# Patient Record
Sex: Female | Born: 1955
Health system: Southern US, Community
[De-identification: ages and names within clinical notes are randomized; demographics above are authoritative.]

## PROBLEM LIST (undated history)

## (undated) DIAGNOSIS — M1990 Unspecified osteoarthritis, unspecified site: Secondary | ICD-10-CM

## (undated) DIAGNOSIS — Z8489 Family history of other specified conditions: Secondary | ICD-10-CM

## (undated) DIAGNOSIS — E079 Disorder of thyroid, unspecified: Secondary | ICD-10-CM

## (undated) DIAGNOSIS — I10 Essential (primary) hypertension: Secondary | ICD-10-CM

## (undated) DIAGNOSIS — K219 Gastro-esophageal reflux disease without esophagitis: Secondary | ICD-10-CM

## (undated) DIAGNOSIS — E039 Hypothyroidism, unspecified: Secondary | ICD-10-CM

## (undated) HISTORY — DX: Essential (primary) hypertension: I10

## (undated) HISTORY — PX: CHOLECYSTECTOMY: SHX55

## (undated) HISTORY — PX: DILATION AND CURETTAGE OF UTERUS: SHX78

## (undated) HISTORY — PX: COLONOSCOPY: SHX174

## (undated) HISTORY — PX: OTHER SURGICAL HISTORY: SHX169

---

## 2004-10-23 DIAGNOSIS — E039 Hypothyroidism, unspecified: Secondary | ICD-10-CM | POA: Insufficient documentation

## 2004-12-17 ENCOUNTER — Ambulatory Visit: Payer: Self-pay

## 2005-01-01 ENCOUNTER — Ambulatory Visit: Payer: Self-pay | Admitting: Family Medicine

## 2005-08-04 DIAGNOSIS — K2100 Gastro-esophageal reflux disease with esophagitis, without bleeding: Secondary | ICD-10-CM | POA: Insufficient documentation

## 2005-08-06 ENCOUNTER — Ambulatory Visit: Payer: Self-pay | Admitting: Family Medicine

## 2006-03-10 ENCOUNTER — Ambulatory Visit: Payer: Self-pay

## 2007-03-30 DIAGNOSIS — E7800 Pure hypercholesterolemia, unspecified: Secondary | ICD-10-CM | POA: Insufficient documentation

## 2007-04-02 DIAGNOSIS — R7989 Other specified abnormal findings of blood chemistry: Secondary | ICD-10-CM | POA: Insufficient documentation

## 2007-05-04 ENCOUNTER — Ambulatory Visit: Payer: Self-pay

## 2008-03-30 DIAGNOSIS — E669 Obesity, unspecified: Secondary | ICD-10-CM | POA: Insufficient documentation

## 2008-04-28 ENCOUNTER — Ambulatory Visit: Payer: Self-pay | Admitting: Family Medicine

## 2008-05-04 ENCOUNTER — Ambulatory Visit: Payer: Self-pay | Admitting: Family Medicine

## 2008-06-06 ENCOUNTER — Ambulatory Visit: Payer: Self-pay | Admitting: Gastroenterology

## 2008-06-06 LAB — HM COLONOSCOPY

## 2009-04-13 DIAGNOSIS — D649 Anemia, unspecified: Secondary | ICD-10-CM | POA: Insufficient documentation

## 2009-04-19 DIAGNOSIS — E559 Vitamin D deficiency, unspecified: Secondary | ICD-10-CM | POA: Insufficient documentation

## 2009-05-08 ENCOUNTER — Ambulatory Visit: Payer: Self-pay | Admitting: Family Medicine

## 2010-07-17 ENCOUNTER — Ambulatory Visit: Payer: Self-pay

## 2010-07-30 ENCOUNTER — Ambulatory Visit: Payer: Self-pay

## 2011-02-04 ENCOUNTER — Ambulatory Visit: Payer: Self-pay

## 2011-08-20 ENCOUNTER — Ambulatory Visit: Payer: Self-pay | Admitting: Family Medicine

## 2012-08-26 ENCOUNTER — Ambulatory Visit: Payer: Self-pay | Admitting: Family Medicine

## 2013-08-29 ENCOUNTER — Ambulatory Visit: Payer: Self-pay | Admitting: Family Medicine

## 2014-08-16 LAB — HM PAP SMEAR: HM Pap smear: NEGATIVE

## 2014-12-20 ENCOUNTER — Ambulatory Visit: Payer: Self-pay | Admitting: Family Medicine

## 2015-07-23 ENCOUNTER — Other Ambulatory Visit: Payer: Self-pay | Admitting: Family Medicine

## 2015-07-23 DIAGNOSIS — O039 Complete or unspecified spontaneous abortion without complication: Secondary | ICD-10-CM | POA: Insufficient documentation

## 2015-07-23 DIAGNOSIS — N950 Postmenopausal bleeding: Secondary | ICD-10-CM | POA: Insufficient documentation

## 2015-07-23 NOTE — Telephone Encounter (Signed)
Refill request received from [facility] requesting levothyroxine 112 mcg.

## 2015-07-27 ENCOUNTER — Encounter: Payer: Self-pay | Admitting: Physician Assistant

## 2015-07-27 ENCOUNTER — Ambulatory Visit (INDEPENDENT_AMBULATORY_CARE_PROVIDER_SITE_OTHER): Payer: BLUE CROSS/BLUE SHIELD | Admitting: Physician Assistant

## 2015-07-27 VITALS — BP 120/82 | HR 76 | Temp 98.1°F | Resp 16 | Wt 199.6 lb

## 2015-07-27 DIAGNOSIS — E039 Hypothyroidism, unspecified: Secondary | ICD-10-CM | POA: Diagnosis not present

## 2015-07-27 MED ORDER — LEVOTHYROXINE SODIUM 112 MCG PO TABS: 112.0000 ug | ORAL_TABLET | Freq: Every day | ORAL | Status: DC

## 2015-07-27 NOTE — Progress Notes (Signed)
Patient: Kristine Strickland Female    DOB: 1956-06-12   59 y.o.   MRN: [id number] Visit Date: 07/27/2015  Today's Provider: Margaretann Loveless, PA-C   Chief Complaint  Patient presents with  . Hypothyroidism    Follow up   Subjective:    Thyroid Problem Presents for follow-up visit. Symptoms include palpitations (sometimes) and weight gain. Patient reports no anxiety, constipation, depressed mood, fatigue, leg swelling, nail problem or visual change. The symptoms have been stable. Past treatments include levothyroxine. The treatment provided significant relief.   she has been on the current dose of 112 g levothyroxin for many years now. She does feel that she is very well controlled at this dose. She has no other complaints today.     Allergies  Allergen Reactions  . Codeine     Not allergic just has N/V  . Penicillins     Was told she had an allery as a child.  She does not know what type of reaction she has.  . Sulfa Antibiotics     As child.  Reports mother told her she was allergic .  She is unsure of reaction.   Previous Medications   ASPIRIN 81 MG TABLET    Take 1 tablet by mouth daily.   CHOLECALCIFEROL (VITAMIN D) 1000 UNITS TABLET    Take 1,000 Units by mouth daily.   DOCUSATE SODIUM (COLACE) 100 MG CAPSULE    Take 2 capsules by mouth daily.   LEVOTHYROXINE (SYNTHROID, LEVOTHROID) 112 MCG TABLET    TAKE (1) TABLET DAILY FOR THYROID.   MELATONIN 5 MG TABS    Take 1 tablet by mouth at bedtime.   OMEGA-3 FATTY ACIDS (FISH OIL) 1200 MG CAPS    Take 2 capsules by mouth 2 (two) times daily.   OMEPRAZOLE (PRILOSEC) 20 MG CAPSULE    Take by mouth.    Review of Systems  Constitutional: Positive for weight gain. Negative for fatigue.  HENT: Negative.   Eyes: Negative.   Respiratory: Negative.   Cardiovascular: Positive for palpitations (sometimes).  Gastrointestinal: Negative.  Negative for constipation.  Endocrine: Negative.   Genitourinary: Negative.     Musculoskeletal: Negative.   Skin: Negative.   Allergic/Immunologic: Negative.   Neurological: Negative.   Hematological: Negative.   Psychiatric/Behavioral: Negative.  The patient is not nervous/anxious.     History  Substance Use Topics  . Smoking status: Never Smoker   . Smokeless tobacco: Never Used  . Alcohol Use: No   Objective:   BP 120/82 mmHg  Pulse 76  Temp(Src) 98.1 F (36.7 C) (Oral)  Resp 16  Wt 199 lb 9.6 oz (90.538 kg)  Physical Exam  Constitutional: She appears well-developed and well-nourished. No distress.  Neck: Normal range of motion. Neck supple. No JVD present. No tracheal deviation present. No thyromegaly present.  Cardiovascular: Normal rate, regular rhythm and normal heart sounds.  Exam reveals no gallop and no friction rub.   No murmur heard. Pulmonary/Chest: Effort normal and breath sounds normal. No respiratory distress. She has no wheezes. She has no rales.  Lymphadenopathy:    She has no cervical adenopathy.  Skin: She is not diaphoretic.  Vitals reviewed.       Assessment & Plan:     1. Hypothyroidism, unspecified hypothyroidism type Stable. Continue current medical treatment plan. She will return in 6 months for her physical. We will recheck all labs at that time. - levothyroxine (SYNTHROID, LEVOTHROID) 112 MCG tablet; Take  1 tablet (112 mcg total) by mouth daily before breakfast.  Dispense: 30 tablet; Refill: 6       Margaretann Loveless, PA-C  [facility]

## 2015-07-27 NOTE — Patient Instructions (Signed)
Hypothyroidism The thyroid is a large gland located in the lower front of your neck. The thyroid gland helps control metabolism. Metabolism is how your body handles food. It controls metabolism with the hormone thyroxine. When this gland is underactive (hypothyroid), it produces too little hormone.  CAUSES These include:   Absence or destruction of thyroid tissue.  Goiter due to iodine deficiency.  Goiter due to medications.  Congenital defects (since birth).  Problems with the pituitary. This causes a lack of TSH (thyroid stimulating hormone). This hormone tells the thyroid to turn out more hormone. SYMPTOMS  Lethargy (feeling as though you have no energy)  Cold intolerance  Weight gain (in spite of normal food intake)  Dry skin  Coarse hair  Menstrual irregularity (if severe, may lead to infertility)  Slowing of thought processes Cardiac problems are also caused by insufficient amounts of thyroid hormone. Hypothyroidism in the newborn is cretinism, and is an extreme form. It is important that this form be treated adequately and immediately or it will lead rapidly to retarded physical and mental development. DIAGNOSIS  To prove hypothyroidism, your caregiver may do blood tests and ultrasound tests. Sometimes the signs are hidden. It may be necessary for your caregiver to watch this illness with blood tests either before or after diagnosis and treatment. TREATMENT  Low levels of thyroid hormone are increased by using synthetic thyroid hormone. This is a safe, effective treatment. It usually takes about four weeks to gain the full effects of the medication. After you have the full effect of the medication, it will generally take another four weeks for problems to leave. Your caregiver may start you on low doses. If you have had heart problems the dose may be gradually increased. It is generally not an emergency to get rapidly to normal. HOME CARE INSTRUCTIONS   Take your  medications as your caregiver suggests. Let your caregiver know of any medications you are taking or start taking. Your caregiver will help you with dosage schedules.  As your condition improves, your dosage needs may increase. It will be necessary to have continuing blood tests as suggested by your caregiver.  Report all suspected medication side effects to your caregiver. SEEK MEDICAL CARE IF: Seek medical care if you develop:  Sweating.  Tremulousness (tremors).  Anxiety.  Rapid weight loss.  Heat intolerance.  Emotional swings.  Diarrhea.  Weakness. SEEK IMMEDIATE MEDICAL CARE IF:  You develop chest pain, an irregular heart beat (palpitations), or a rapid heart beat. MAKE SURE YOU:   Understand these instructions.  Will watch your condition.  Will get help right away if you are not doing well or get worse. Document Released: 12/08/2005 Document Revised: 03/01/2012 Document Reviewed: 07/28/2008 ExitCare Patient Information 2015 [employer]. This information is not intended to replace advice given to you by your health care provider. Make sure you discuss any questions you have with your health care provider.  

## 2015-08-29 ENCOUNTER — Ambulatory Visit (INDEPENDENT_AMBULATORY_CARE_PROVIDER_SITE_OTHER): Payer: BLUE CROSS/BLUE SHIELD | Admitting: Physician Assistant

## 2015-08-29 ENCOUNTER — Encounter: Payer: Self-pay | Admitting: Physician Assistant

## 2015-08-29 VITALS — BP 132/74 | HR 76 | Temp 98.3°F | Resp 16 | Wt 180.2 lb

## 2015-08-29 DIAGNOSIS — E039 Hypothyroidism, unspecified: Secondary | ICD-10-CM | POA: Diagnosis not present

## 2015-08-29 MED ORDER — LEVOTHYROXINE SODIUM 150 MCG PO TABS: 150.0000 ug | ORAL_TABLET | Freq: Every day | ORAL | Status: DC

## 2015-08-29 NOTE — Patient Instructions (Signed)
Hypothyroidism The thyroid is a large gland located in the lower front of your neck. The thyroid gland helps control metabolism. Metabolism is how your body handles food. It controls metabolism with the hormone thyroxine. When this gland is underactive (hypothyroid), it produces too little hormone.  CAUSES These include:   Absence or destruction of thyroid tissue.  Goiter due to iodine deficiency.  Goiter due to medications.  Congenital defects (since birth).  Problems with the pituitary. This causes a lack of TSH (thyroid stimulating hormone). This hormone tells the thyroid to turn out more hormone. SYMPTOMS  Lethargy (feeling as though you have no energy)  Cold intolerance  Weight gain (in spite of normal food intake)  Dry skin  Coarse hair  Menstrual irregularity (if severe, may lead to infertility)  Slowing of thought processes Cardiac problems are also caused by insufficient amounts of thyroid hormone. Hypothyroidism in the newborn is cretinism, and is an extreme form. It is important that this form be treated adequately and immediately or it will lead rapidly to retarded physical and mental development. DIAGNOSIS  To prove hypothyroidism, your caregiver may do blood tests and ultrasound tests. Sometimes the signs are hidden. It may be necessary for your caregiver to watch this illness with blood tests either before or after diagnosis and treatment. TREATMENT  Low levels of thyroid hormone are increased by using synthetic thyroid hormone. This is a safe, effective treatment. It usually takes about four weeks to gain the full effects of the medication. After you have the full effect of the medication, it will generally take another four weeks for problems to leave. Your caregiver may start you on low doses. If you have had heart problems the dose may be gradually increased. It is generally not an emergency to get rapidly to normal. HOME CARE INSTRUCTIONS   Take your  medications as your caregiver suggests. Let your caregiver know of any medications you are taking or start taking. Your caregiver will help you with dosage schedules.  As your condition improves, your dosage needs may increase. It will be necessary to have continuing blood tests as suggested by your caregiver.  Report all suspected medication side effects to your caregiver. SEEK MEDICAL CARE IF: Seek medical care if you develop:  Sweating.  Tremulousness (tremors).  Anxiety.  Rapid weight loss.  Heat intolerance.  Emotional swings.  Diarrhea.  Weakness. SEEK IMMEDIATE MEDICAL CARE IF:  You develop chest pain, an irregular heart beat (palpitations), or a rapid heart beat. MAKE SURE YOU:   Understand these instructions.  Will watch your condition.  Will get help right away if you are not doing well or get worse. Document Released: 12/08/2005 Document Revised: 03/01/2012 Document Reviewed: 07/28/2008 ExitCare Patient Information 2015 [employer]. This information is not intended to replace advice given to you by your health care provider. Make sure you discuss any questions you have with your health care provider.  

## 2015-08-29 NOTE — Progress Notes (Signed)
Patient ID: Kristine Strickland, female   DOB: 1956/01/07, 59 y.o.   MRN: [id number]  Hypothyroid, follow-up:  No results found for: TSH Wt Readings from Last 3 Encounters:  08/29/15 180 lb 3.2 oz (81.738 kg)  07/27/15 199 lb 9.6 oz (90.538 kg)   She does report a dry mouth, possible side effect from the new weight loss program medication, phentermine 15 mg.  She was last seen for hypothyroid 1 months ago.  Management since that visit includes no changers were made, [facility] told her to see her PCP. She reports excellent compliance with treatment. She is not having side effects.  She is exercising. She is experiencing none She denies none Weight trend: decreasing rapidly  ------------------------------------------------------------------------         Patient: Kristine Strickland Female    DOB: 12-23-1955   59 y.o.   MRN: [id number] Visit Date: 08/29/2015  Today's Provider: Margaretann Loveless, PA-C   Chief Complaint  Patient presents with  . Hypothyroidism    follow-up, taking lethyroxine 112 mcg.   Subjective:    HPI She states that she has not had the classic signs when her thyroid levels become abnormal such as tiredness, fatigue, sluggishness, and weight gain.  She has been on phentermine for weight loss and has also been exercising.  She states she feels great and has a lot of energy.  She had some lab work done through the weight loss clinic on 07/27/15 and TSH level was 17.100.  She had been on levothyroxine for many years.    Allergies  Allergen Reactions  . Codeine     Not allergic just has N/V  . Penicillins     Was told she had an allery as a child.  She does not know what type of reaction she has.  . Sulfa Antibiotics     As child.  Reports mother told her she was allergic .  She is unsure of reaction.   Previous Medications   ASPIRIN 81 MG TABLET    Take 1 tablet by mouth daily.   CHOLECALCIFEROL (VITAMIN D) 1000 UNITS  TABLET    Take 1,000 Units by mouth daily.   DOCUSATE SODIUM (COLACE) 100 MG CAPSULE    Take 2 capsules by mouth daily.   LEVOTHYROXINE (SYNTHROID, LEVOTHROID) 112 MCG TABLET    Take 1 tablet (112 mcg total) by mouth daily before breakfast.   MELATONIN 5 MG TABS    Take 1 tablet by mouth at bedtime.   OMEGA-3 FATTY ACIDS (FISH OIL) 1200 MG CAPS    Take 2 capsules by mouth 2 (two) times daily.   OMEPRAZOLE (PRILOSEC) 20 MG CAPSULE    Take by mouth.   PHENTERMINE 15 MG CAPSULE    Take 15 mg by mouth every morning.    Review of Systems  Constitutional: Negative.   HENT: Negative.   Respiratory: Negative.   Cardiovascular: Negative.   Gastrointestinal: Negative.   Endocrine: Negative.     Social History  Substance Use Topics  . Smoking status: Never Smoker   . Smokeless tobacco: Never Used  . Alcohol Use: No   Objective:   BP 132/74 mmHg  Pulse 76  Temp(Src) 98.3 F (36.8 C) (Oral)  Resp 16  Wt 180 lb 3.2 oz (81.738 kg)  SpO2 98%  Physical Exam  Constitutional: She appears well-developed and well-nourished. No distress.  Neck: Normal range of motion. Neck supple. No JVD present. No tracheal deviation  present. No thyromegaly present.  Cardiovascular: Normal rate, regular rhythm and normal heart sounds.  Exam reveals no gallop and no friction rub.   No murmur heard. Pulmonary/Chest: Effort normal and breath sounds normal. No respiratory distress. She has no wheezes. She has no rales.  Lymphadenopathy:    She has no cervical adenopathy.  Skin: She is not diaphoretic.  Vitals reviewed.       Assessment & Plan:     1. Hypothyroidism, unspecified hypothyroidism type Most recent labwork showed a TSH of 17.100 on 07/27/15.  I will increase her levothyroxine from to .  She has a follow up appointment in November 2016 for a CPE.  We will recheck her thyroid level at that time and adjust dose as needed if still not therapeutic. - levothyroxine (SYNTHROID, LEVOTHROID)  150 MCG tablet; Take 1 tablet (150 mcg total) by mouth daily.  Dispense: 90 tablet; Refill: 0       Margaretann Loveless, PA-C  [facility]

## 2015-09-07 ENCOUNTER — Encounter: Payer: Self-pay | Admitting: Physician Assistant

## 2015-09-28 ENCOUNTER — Other Ambulatory Visit: Payer: Self-pay | Admitting: Physician Assistant

## 2015-09-28 DIAGNOSIS — E039 Hypothyroidism, unspecified: Secondary | ICD-10-CM

## 2015-11-22 ENCOUNTER — Encounter: Payer: BLUE CROSS/BLUE SHIELD | Admitting: Physician Assistant

## 2015-11-26 ENCOUNTER — Ambulatory Visit (INDEPENDENT_AMBULATORY_CARE_PROVIDER_SITE_OTHER): Payer: BLUE CROSS/BLUE SHIELD | Admitting: Physician Assistant

## 2015-11-26 ENCOUNTER — Encounter: Payer: Self-pay | Admitting: Physician Assistant

## 2015-11-26 VITALS — BP 130/84 | HR 88 | Temp 98.6°F | Resp 16 | Ht 64.0 in | Wt 174.2 lb

## 2015-11-26 DIAGNOSIS — M25551 Pain in right hip: Secondary | ICD-10-CM | POA: Diagnosis not present

## 2015-11-26 DIAGNOSIS — D508 Other iron deficiency anemias: Secondary | ICD-10-CM | POA: Diagnosis not present

## 2015-11-26 DIAGNOSIS — E7800 Pure hypercholesterolemia, unspecified: Secondary | ICD-10-CM | POA: Diagnosis not present

## 2015-11-26 DIAGNOSIS — E559 Vitamin D deficiency, unspecified: Secondary | ICD-10-CM

## 2015-11-26 DIAGNOSIS — Z1239 Encounter for other screening for malignant neoplasm of breast: Secondary | ICD-10-CM | POA: Diagnosis not present

## 2015-11-26 DIAGNOSIS — Z0000 Encounter for general adult medical examination without abnormal findings: Secondary | ICD-10-CM

## 2015-11-26 DIAGNOSIS — E039 Hypothyroidism, unspecified: Secondary | ICD-10-CM | POA: Diagnosis not present

## 2015-11-26 DIAGNOSIS — Z23 Encounter for immunization: Secondary | ICD-10-CM | POA: Diagnosis not present

## 2015-11-26 MED ORDER — MELOXICAM 7.5 MG PO TABS: 7.5000 mg | ORAL_TABLET | Freq: Every day | ORAL | Status: DC

## 2015-11-26 NOTE — Progress Notes (Addendum)
Patient: Kristine Strickland, Female    DOB: [birth year], 59 y.o.   MRN: [id number] Visit Date: 11/26/2015  Today's Provider: Margaretann Loveless, PA-C   Chief Complaint  Patient presents with  . Annual Exam   Subjective:    Annual physical exam Kristine Strickland is a 59 y.o. female who presents today for health maintenance and complete physical. She feels well. She reports exercising 3 time a week does [place]. She reports she is sleeping well with the Melatonin. Pap smear was done last year 2015 and was normal. There is no family or personal history of cervical, ovarian or uterine cancer. Mammogram was done in 2015 and was normal. She does perform monthly self breast exams. There is no family history of breast cancer. Colonoscopy was done in 2008 by Dr. Servando Snare. She reports findings were normal and to repeat in 10 years. There is no family history of colon cancer.  Last PCP:11/03/14 BMD: 12/20/14 Normal repeat in 2 years. Pap:08/16/14 Normal, HPV-Negative Mammogram:08/16/14 BI-RADS Category 2: Benign Findings. Continued annual screening mammography is recommended Colonoscopy: Normal- 09/19/2007 - Dr. Servando Snare; Repeat in 10 years -----------------------------------------------------------------   Review of Systems  Constitutional: Negative.   HENT: Negative.   Eyes: Negative.   Respiratory: Negative.   Cardiovascular: Negative.   Gastrointestinal: Negative.   Endocrine: Negative.   Genitourinary: Negative.   Musculoskeletal: Negative.   Skin: Negative.   Allergic/Immunologic: Negative.   Neurological: Positive for numbness (hands).  Hematological: Negative.   Psychiatric/Behavioral: Negative.     Social History      She  reports that she has never smoked. She has never used smokeless tobacco. She reports that she does not drink alcohol or use illicit drugs.       Social History   Social History  . Marital Status: Married    Spouse Name: N/A  . Number of Children: N/A    . Years of Education: N/A   Social History Main Topics  . Smoking status: Never Smoker   . Smokeless tobacco: Never Used  . Alcohol Use: No  . Drug Use: No  . Sexual Activity: Not Asked   Other Topics Concern  . None   Social History Narrative    Patient Active Problem List   Diagnosis Date Noted  . Abortion, spontaneous 07/23/2015  . Postmenopausal bleeding 07/23/2015  . Avitaminosis D 04/19/2009  . Absolute anemia 04/13/2009  . Adiposity 03/30/2008  . Abnormal C-reactive protein 04/02/2007  . Hypercholesterolemia without hypertriglyceridemia 03/30/2007  . Esophagitis, reflux 08/04/2005  . Adult hypothyroidism 10/23/2004    Past Surgical History  Procedure Laterality Date  . Cholecystectomy    . Dilation and curettage of uterus    . Bladder stem    . Colonoscopy      Family History        Family Status  Relation Status Death Age  . Mother Alive   . Maternal Grandmother Deceased 70'S  . Maternal Grandfather Deceased 42'S  . Paternal Grandfather Deceased 68'S  . Father Alive         Her family history includes Alcohol abuse in her brother; Diverticulitis in her sister; Epilepsy in her mother; Heart attack in her paternal grandfather; Heart failure in her maternal grandfather; Hyperlipidemia in her mother; Hypertension in her maternal grandmother, mother, and paternal grandfather; Hypothyroidism in her sister; Pancreatic cancer in her maternal grandmother.    Allergies  Allergen Reactions  . Codeine     Not allergic just has  N/V  . Penicillins     Was told she had an allery as a child.  She does not know what type of reaction she has.  . Sulfa Antibiotics     As child.  Reports mother told her she was allergic .  She is unsure of reaction.    Previous Medications   ASPIRIN 81 MG TABLET    Take 1 tablet by mouth daily.   CHOLECALCIFEROL (VITAMIN D) 1000 UNITS TABLET    Take 1,000 Units by mouth daily.   DOCUSATE SODIUM (COLACE) 100 MG CAPSULE    Take 2  capsules by mouth daily.   LEVOTHYROXINE (SYNTHROID, LEVOTHROID) 150 MCG TABLET    TAKE (1) TABLET DAILY FOR THYROID.   MELATONIN 5 MG TABS    Take 1 tablet by mouth at bedtime.   OMEGA-3 FATTY ACIDS (FISH OIL) 1200 MG CAPS    Take 2 capsules by mouth 2 (two) times daily.   OMEPRAZOLE (PRILOSEC) 20 MG CAPSULE    Take by mouth.    Patient Care Team: Margaretann Loveless, PA-C as PCP - General (Physician Assistant)     Objective:   Vitals: BP 130/84 mmHg  Pulse 88  Temp(Src) 98.6 F (37 C) (Oral)  Resp 16  Ht  (1.626 m)  Wt 174 lb 3.2 oz (79.017 kg)  BMI 29.89 kg/m2   Physical Exam  Constitutional: She is oriented to person, place, and time. She appears well-developed and well-nourished. No distress.  HENT:  Head: Normocephalic and atraumatic.  Right Ear: Hearing, tympanic membrane, external ear and ear canal normal.  Left Ear: Hearing, tympanic membrane, external ear and ear canal normal.  Nose: Nose normal.  Mouth/Throat: Uvula is midline, oropharynx is clear and moist and mucous membranes are normal. No oropharyngeal exudate.  Eyes: Conjunctivae and EOM are normal. Pupils are equal, round, and reactive to light. Right eye exhibits no discharge. Left eye exhibits no discharge. No scleral icterus.  Neck: Normal range of motion. Neck supple. No JVD present. Carotid bruit is not present. No tracheal deviation present. No thyromegaly present.  Cardiovascular: Normal rate, regular rhythm, normal heart sounds and intact distal pulses.  Exam reveals no gallop and no friction rub.   No murmur heard. Pulmonary/Chest: Effort normal and breath sounds normal. No respiratory distress. She has no wheezes. She has no rales. She exhibits no tenderness. Right breast exhibits no inverted nipple, no mass, no nipple discharge, no skin change and no tenderness. Left breast exhibits no inverted nipple, no mass, no nipple discharge, no skin change and no tenderness. Breasts are symmetrical.    Abdominal: Soft. Bowel sounds are normal. She exhibits no distension and no mass. There is no tenderness. There is no rebound and no guarding. Hernia confirmed negative in the right inguinal area and confirmed negative in the left inguinal area.  Genitourinary: Rectum normal, vagina normal and uterus normal. No breast swelling, tenderness, discharge or bleeding. Pelvic exam was performed with patient supine. There is no rash, tenderness, lesion or injury on the right labia. There is no rash, tenderness, lesion or injury on the left labia. Cervix exhibits no motion tenderness, no discharge and no friability. Right adnexum displays no mass, no tenderness and no fullness. Left adnexum displays no mass, no tenderness and no fullness. No erythema, tenderness or bleeding in the vagina. No signs of injury around the vagina. No vaginal discharge found.  Musculoskeletal: Normal range of motion. She exhibits no edema or tenderness.  Lymphadenopathy:  She has no cervical adenopathy.       Right: No inguinal adenopathy present.       Left: No inguinal adenopathy present.  Neurological: She is alert and oriented to person, place, and time. She has normal reflexes. No cranial nerve deficit. Coordination normal.  Skin: Skin is warm and dry. No rash noted. She is not diaphoretic.  Psychiatric: She has a normal mood and affect. Her behavior is normal. Judgment and thought content normal.  Vitals reviewed.    Depression Screen No flowsheet data found.    Assessment & Plan:     Routine Health Maintenance and Physical Exam  1. Annual physical exam Normal physical exam today. I will check labs and follow-up pending lab results. If labs are stable I will see her back in one year for her repeat physical exam. She may call the office in the meantime for any acute issue, questions or concerns.  2. Need for influenza vaccination Flu vaccine was given today without complication. - Flu Vaccine QUAD 36+ mos  IM  3. Screening for breast cancer Breast exam today was normal. She does perform self monthly breast exams. There is no family history of breast cancer. [facility] was given to patient so that she may call and schedule an appointment for her annual screening mammogram at her convenience. - MM DIGITAL SCREENING BILATERAL; Future  4. Arthralgia of hip or thigh, right Right hip arthritis has been stable. She would like a refill of her meloxicam. She does not take meloxicam daily only when she has a flareup of her arthritis. I will refill her meloxicam as below. She is to call the office if symptoms become persistent and worsened. - meloxicam (MOBIC) 7.5 MG tablet; Take 1 tablet (7.5 mg total) by mouth daily.  Dispense: 60 tablet; Refill: 3  5. Avitaminosis D Currently stable on vitamin D supplement 1000 international units daily. I will recheck her vitamin D level and follow-up pending results. In the meantime she is to continue her vitamin D supplement. - Comprehensive Metabolic Panel (CMET) - Vitamin D (25 hydroxy)  6. Hypothyroidism, unspecified hypothyroidism type Currently stable on levothyroxine 150 g. I will recheck TSH and follow-up pending lab results. If labs are stable she will continue current dose and we will recheck in one year. - TSH *Addendum: TSH was low at 0.251 so will lower levothyroxine dose to 125mcg instead of 150mcg.  We will recheck TSH in 3 months.  7. Other iron deficiency anemias History of absolute anemia. I will recheck blood count and follow-up pending lab results. - CBC with Differential - Comprehensive Metabolic Panel (CMET)  8. Hypercholesterolemia without hypertriglyceridemia Currently stable and has been controlled with lifestyle modifications including diet and exercise. She also takes Fish oil daily. I will recheck her cholesterol panel and follow-up with her pending lab results. If labs are stable she will continue current  medical treatment with lifestyle changes and follow-up in one year with repeat labs. - Lipid panel   Exercise Activities and Dietary recommendations Goals    None      Immunization History  Administered Date(s) Administered  . Td 10/23/2004  . Tdap 07/08/2011    Health Maintenance  Topic Date Due  . Hepatitis C Screening  July 03, 1956  . HIV Screening  09/18/1973  . PAP SMEAR  09/19/1979  . MAMMOGRAM  09/18/2008  . COLONOSCOPY  09/18/2008  . INFLUENZA VACCINE  07/23/2015  . TETANUS/TDAP  07/07/2021  Discussed health benefits of physical activity, and encouraged her to engage in regular exercise appropriate for her age and condition.    --------------------------------------------------------------------

## 2015-11-26 NOTE — Patient Instructions (Signed)

## 2015-11-27 ENCOUNTER — Telehealth: Payer: Self-pay

## 2015-11-27 LAB — CBC WITH DIFFERENTIAL/PLATELET
BASOS: 1 %
Basophils Absolute: 0 10*3/uL (ref 0.0–0.2)
EOS (ABSOLUTE): 0.2 10*3/uL (ref 0.0–0.4)
EOS: 4 %
HEMATOCRIT: 38.7 % (ref 34.0–46.6)
Hemoglobin: 12.9 g/dL (ref 11.1–15.9)
IMMATURE GRANS (ABS): 0 10*3/uL (ref 0.0–0.1)
IMMATURE GRANULOCYTES: 0 %
LYMPHS: 29 %
Lymphocytes Absolute: 1.5 10*3/uL (ref 0.7–3.1)
MCH: 26.6 pg (ref 26.6–33.0)
MCHC: 33.3 g/dL (ref 31.5–35.7)
MCV: 80 fL (ref 79–97)
MONOS ABS: 0.4 10*3/uL (ref 0.1–0.9)
Monocytes: 8 %
NEUTROS PCT: 58 %
Neutrophils Absolute: 3 10*3/uL (ref 1.4–7.0)
PLATELETS: 303 10*3/uL (ref 150–379)
RBC: 4.85 x10E6/uL (ref 3.77–5.28)
RDW: 14.8 % (ref 12.3–15.4)
WBC: 5.2 10*3/uL (ref 3.4–10.8)

## 2015-11-27 LAB — COMPREHENSIVE METABOLIC PANEL
A/G RATIO: 1.8 (ref 1.1–2.5)
ALT: 12 IU/L (ref 0–32)
AST: 19 IU/L (ref 0–40)
Albumin: 4.4 g/dL (ref 3.5–5.5)
Alkaline Phosphatase: 72 IU/L (ref 39–117)
BUN/Creatinine Ratio: 20 (ref 9–23)
BUN: 16 mg/dL (ref 6–24)
Bilirubin Total: 0.3 mg/dL (ref 0.0–1.2)
CALCIUM: 9.6 mg/dL (ref 8.7–10.2)
CO2: 26 mmol/L (ref 18–29)
CREATININE: 0.8 mg/dL (ref 0.57–1.00)
Chloride: 99 mmol/L (ref 97–106)
GFR, EST AFRICAN AMERICAN: 95 mL/min/{1.73_m2} (ref >59)
GFR, EST NON AFRICAN AMERICAN: 82 mL/min/{1.73_m2} (ref >59)
Globulin, Total: 2.5 g/dL (ref 1.5–4.5)
Glucose: 89 mg/dL (ref 65–99)
Potassium: 4.2 mmol/L (ref 3.5–5.2)
Sodium: 141 mmol/L (ref 136–144)
TOTAL PROTEIN: 6.9 g/dL (ref 6.0–8.5)

## 2015-11-27 LAB — LIPID PANEL
CHOL/HDL RATIO: 2.8 ratio (ref 0.0–4.4)
Cholesterol, Total: 214 mg/dL — ABNORMAL HIGH (ref 100–199)
HDL: 76 mg/dL (ref >39)
LDL CALC: 128 mg/dL — AB (ref 0–99)
TRIGLYCERIDES: 52 mg/dL (ref 0–149)
VLDL CHOLESTEROL CAL: 10 mg/dL (ref 5–40)

## 2015-11-27 LAB — VITAMIN D 25 HYDROXY (VIT D DEFICIENCY, FRACTURES): Vit D, 25-Hydroxy: 26.6 ng/mL — ABNORMAL LOW (ref 30.0–100.0)

## 2015-11-27 LAB — TSH: TSH: 0.251 u[IU]/mL — ABNORMAL LOW (ref 0.450–4.500)

## 2015-11-27 MED ORDER — LEVOTHYROXINE SODIUM 125 MCG PO TABS: 125.0000 ug | ORAL_TABLET | Freq: Every day | ORAL | Status: DC

## 2015-11-27 NOTE — Telephone Encounter (Signed)
Patient advised as directed below. Patient voice understanding.  Thanks,  -Joseline 

## 2015-11-27 NOTE — Telephone Encounter (Signed)
-----   Message from Margaretann LovelessJennifer M Burnette, [place] JerseyPA-C sent at 11/27/2015  8:44 AM EST ----- All labs are within normal limits and stable with exception of TSH which is slightly low at 0.251.  We may need to decrease your dose of levothyroxine to 125mcg.  I will send in the new Rx for you.  We will recheck in 3 months to make sure that the new dose is appropriate.  Continue Vit D supplement as well. Thanks! -JB

## 2015-11-27 NOTE — Addendum Note (Signed)
Addended by: Margaretann LovelessBURNETTE, Ahan Eisenberger M on: 11/27/2015 08:47 AM   Modules accepted: Orders, Medications, SmartSet

## 2016-01-07 ENCOUNTER — Ambulatory Visit
Admission: RE | Admit: 2016-01-07 | Discharge: 2016-01-07 | Disposition: A | Discharge status: Home / Self Care | Payer: BLUE CROSS/BLUE SHIELD | Source: Ambulatory Visit | Attending: Physician Assistant | Admitting: Physician Assistant

## 2016-01-07 DIAGNOSIS — Z1239 Encounter for other screening for malignant neoplasm of breast: Secondary | ICD-10-CM

## 2016-01-07 DIAGNOSIS — Z1231 Encounter for screening mammogram for malignant neoplasm of breast: Secondary | ICD-10-CM | POA: Insufficient documentation

## 2016-01-08 ENCOUNTER — Telehealth: Payer: Self-pay

## 2016-01-08 NOTE — Telephone Encounter (Signed)
-----   Message from Margaretann Loveless, PA-C sent at 01/08/2016  8:33 AM EST ----- Normal mammogram. Repeat screening in one year.

## 2016-01-08 NOTE — Telephone Encounter (Signed)
Patient advised as directed below.  Thanks,  -Angelie Kram 

## 2016-05-06 ENCOUNTER — Telehealth: Payer: Self-pay | Admitting: Physician Assistant

## 2016-05-06 DIAGNOSIS — E039 Hypothyroidism, unspecified: Secondary | ICD-10-CM

## 2016-05-06 DIAGNOSIS — E559 Vitamin D deficiency, unspecified: Secondary | ICD-10-CM

## 2016-05-06 NOTE — Telephone Encounter (Signed)
I was not sure if she needed other labs.

## 2016-05-06 NOTE — Telephone Encounter (Signed)
Pt wants to pick up lab sheet to have thyroid rechecked.  Pt will pick up.  Thanks Fortune Brandsteri

## 2016-05-06 NOTE — Telephone Encounter (Signed)
LM that lab order is ready up front for pick up.  Thanks,  -Marybel Alcott

## 2016-05-06 NOTE — Telephone Encounter (Signed)
Lab ordered.

## 2016-05-07 ENCOUNTER — Telehealth: Payer: Self-pay

## 2016-05-07 LAB — TSH: TSH: 22.29 u[IU]/mL — ABNORMAL HIGH (ref 0.450–4.500)

## 2016-05-07 MED ORDER — LEVOTHYROXINE SODIUM 137 MCG PO TABS: 137.0000 ug | ORAL_TABLET | Freq: Every day | ORAL | Status: DC

## 2016-05-07 NOTE — Telephone Encounter (Signed)
Patient advised as directed below. Patient verbalized understanding.  

## 2016-05-07 NOTE — Addendum Note (Signed)
Addended by: Margaretann LovelessBURNETTE, Stacie Knutzen M on: 05/07/2016 08:38 AM   Modules accepted: Orders, Medications

## 2016-05-07 NOTE — Telephone Encounter (Signed)
-----   Message from Margaretann LovelessJennifer M Burnette, [place]PA-C sent at 05/07/2016  8:45 AM EDT ----- TSH is high so I am going to increase the levothyroxine dose. The 150mcg had been too strong and current dose is too low so I will go inbetween with 137mcg and we can recheck in 3-6 months again. [facility] [place].

## 2016-05-07 NOTE — Telephone Encounter (Signed)
LMTCB

## 2016-05-07 NOTE — Telephone Encounter (Signed)
Pt is returning call.  ZO#[phone]/[phone]/MW

## 2016-10-22 DIAGNOSIS — D1800 Hemangioma unspecified site: Secondary | ICD-10-CM | POA: Diagnosis not present

## 2016-10-22 DIAGNOSIS — Z1283 Encounter for screening for malignant neoplasm of skin: Secondary | ICD-10-CM | POA: Diagnosis not present

## 2016-10-22 DIAGNOSIS — D485 Neoplasm of uncertain behavior of skin: Secondary | ICD-10-CM | POA: Diagnosis not present

## 2016-10-22 DIAGNOSIS — D229 Melanocytic nevi, unspecified: Secondary | ICD-10-CM | POA: Diagnosis not present

## 2016-10-27 ENCOUNTER — Telehealth: Payer: Self-pay | Admitting: Physician Assistant

## 2016-10-27 DIAGNOSIS — E039 Hypothyroidism, unspecified: Secondary | ICD-10-CM

## 2016-10-27 MED ORDER — LEVOTHYROXINE SODIUM 137 MCG PO TABS
137.0000 ug | ORAL_TABLET | Freq: Every day | ORAL | 0 refills | Status: DC
Start: 2016-10-27 — End: 2016-11-27

## 2016-10-27 NOTE — Telephone Encounter (Signed)
Pt needs refill on her Synthroid.  She has appt for a CPE in December.  She uses [facility] in [place].  Her call back is [phone]  Thanks, Barth Kirksteri

## 2016-10-27 NOTE — Telephone Encounter (Signed)
Levothyroxine refilled

## 2016-10-27 NOTE — Telephone Encounter (Signed)
Refill request for review. KW 

## 2016-11-26 ENCOUNTER — Ambulatory Visit (INDEPENDENT_AMBULATORY_CARE_PROVIDER_SITE_OTHER): Payer: BLUE CROSS/BLUE SHIELD | Admitting: Physician Assistant

## 2016-11-26 ENCOUNTER — Encounter: Payer: Self-pay | Admitting: Physician Assistant

## 2016-11-26 VITALS — BP 130/82 | HR 84 | Temp 98.3°F | Resp 16 | Ht 64.0 in | Wt 209.6 lb

## 2016-11-26 DIAGNOSIS — Z1159 Encounter for screening for other viral diseases: Secondary | ICD-10-CM | POA: Diagnosis not present

## 2016-11-26 DIAGNOSIS — M25551 Pain in right hip: Secondary | ICD-10-CM | POA: Diagnosis not present

## 2016-11-26 DIAGNOSIS — Z1231 Encounter for screening mammogram for malignant neoplasm of breast: Secondary | ICD-10-CM

## 2016-11-26 DIAGNOSIS — Z131 Encounter for screening for diabetes mellitus: Secondary | ICD-10-CM | POA: Diagnosis not present

## 2016-11-26 DIAGNOSIS — K120 Recurrent oral aphthae: Secondary | ICD-10-CM | POA: Diagnosis not present

## 2016-11-26 DIAGNOSIS — D508 Other iron deficiency anemias: Secondary | ICD-10-CM

## 2016-11-26 DIAGNOSIS — Z0000 Encounter for general adult medical examination without abnormal findings: Secondary | ICD-10-CM | POA: Diagnosis not present

## 2016-11-26 DIAGNOSIS — Z1239 Encounter for other screening for malignant neoplasm of breast: Secondary | ICD-10-CM

## 2016-11-26 DIAGNOSIS — E7800 Pure hypercholesterolemia, unspecified: Secondary | ICD-10-CM

## 2016-11-26 DIAGNOSIS — E039 Hypothyroidism, unspecified: Secondary | ICD-10-CM | POA: Diagnosis not present

## 2016-11-26 MED ORDER — MELOXICAM 7.5 MG PO TABS: 7.5000 mg | ORAL_TABLET | Freq: Every day | ORAL | 3 refills | Status: DC

## 2016-11-26 NOTE — Patient Instructions (Signed)

## 2016-11-26 NOTE — Progress Notes (Signed)
Patient: Kristine PostBarbara J Strickland, Female    DOB: [birth year], 58 y.o.   MRN: [id number] Visit Date: 11/26/2016  Today's Provider: Margaretann LovelessJennifer M Ramonda Galyon, PA-C   No chief complaint on file.  Subjective:    Annual physical exam Kristine Strickland is a 60 y.o. female who presents today for health maintenance and complete physical. She feels well. She reports exercising very little. She reports she is sleeping fairly well.  She is concern that she has gained a lot of weight. She however has not been exercising like she used to and has not been watching what she is eating. She also reports that she got her flu vaccine 10/2016 at the [facility]. She reports she got other shots and that she will fax the record over.  CPE:11/26/15 Mammogram:01/07/16 BI-RADS 1 BMD: 12/20/14 Colonoscopy: Normal repeat in 10 years. -----------------------------------------------------------------  Review of Systems  Constitutional: Positive for appetite change.  HENT: Positive for mouth sores and voice change.   Eyes: Negative.   Respiratory: Negative.   Cardiovascular: Positive for palpitations.  Gastrointestinal: Negative.   Endocrine: Positive for polyphagia.  Genitourinary: Negative.   Musculoskeletal: Positive for arthralgias.  Skin: Negative.   Allergic/Immunologic: Negative.   Neurological: Negative.   Hematological: Negative.   Psychiatric/Behavioral: Negative.     Social History      She  reports that she has never smoked. She has never used smokeless tobacco. She reports that she does not drink alcohol or use drugs.       Social History   Social History  . Marital status: Married    Spouse name: N/A  . Number of children: N/A  . Years of education: N/A   Social History Main Topics  . Smoking status: Never Smoker  . Smokeless tobacco: Never Used  . Alcohol use No  . Drug use: No  . Sexual activity: Not Asked   Other Topics Concern  . None   Social History Narrative    . None    History reviewed. No pertinent past medical history.   Patient Active Problem List   Diagnosis Date Noted  . Abortion, spontaneous 07/23/2015  . Postmenopausal bleeding 07/23/2015  . Avitaminosis D 04/19/2009  . Absolute anemia 04/13/2009  . Adiposity 03/30/2008  . Abnormal C-reactive protein 04/02/2007  . Hypercholesterolemia without hypertriglyceridemia 03/30/2007  . Esophagitis, reflux 08/04/2005  . Adult hypothyroidism 10/23/2004    Past Surgical History:  Procedure Laterality Date  . BLADDER STEM    . CHOLECYSTECTOMY    . COLONOSCOPY    . DILATION AND CURETTAGE OF UTERUS      Family History        Family Status  Relation Status  . Mother Alive  . Maternal Grandmother Deceased at age 58'S  . Maternal Grandfather Deceased at age 58'S  . Paternal Grandfather Deceased at age 58'S  . Father Alive        Her family history includes Alcohol abuse in her brother; Diverticulitis in her sister; Epilepsy in her mother; Heart attack in her paternal grandfather; Heart failure in her maternal grandfather; Hyperlipidemia in her mother; Hypertension in her maternal grandmother, mother, and paternal grandfather; Hypothyroidism in her sister; Pancreatic cancer in her maternal grandmother.     Allergies  Allergen Reactions  . Codeine     Not allergic just has N/V  . Penicillins     Was told she had an allery as a child.  She does not know what type of reaction  she has.  . Sulfa Antibiotics     As child.  Reports mother told her she was allergic .  She is unsure of reaction.     Current Outpatient Prescriptions:  .  aspirin 81 MG tablet, Take 1 tablet by mouth daily., Disp: , Rfl:  .  cholecalciferol (VITAMIN D) 1000 UNITS tablet, Take 1,000 Units by mouth daily., Disp: , Rfl:  .  docusate sodium (COLACE) 100 MG capsule, Take 2 capsules by mouth daily., Disp: , Rfl:  .  levothyroxine (SYNTHROID, LEVOTHROID) 137 MCG tablet, Take 1 tablet (137 mcg total) by mouth  daily before breakfast., Disp: 90 tablet, Rfl: 0 .  Melatonin 5 MG TABS, Take 1 tablet by mouth at bedtime., Disp: , Rfl:  .  meloxicam (MOBIC) 7.5 MG tablet, Take 1 tablet (7.5 mg total) by mouth daily., Disp: 60 tablet, Rfl: 3 .  omeprazole (PRILOSEC) 20 MG capsule, Take by mouth., Disp: , Rfl:  .  Omega-3 Fatty Acids (FISH OIL) 1200 MG CAPS, Take 2 capsules by mouth 2 (two) times daily., Disp: , Rfl:    Patient Care Team: Margaretann LovelessJennifer M Lourdes Manning, PA-C as PCP - General (Physician Assistant)      Objective:   Vitals: BP 130/82 (BP Location: Right Arm, Patient Position: Sitting, Cuff Size: Normal)   Pulse 84   Temp 98.3 F (36.8 C) (Oral)   Resp 16   Ht 5\' 4"  (1.626 m)   Wt 209 lb 9.6 oz (95.1 kg)   BMI 35.98 kg/m    Physical Exam  Constitutional: She is oriented to person, place, and time. She appears well-developed and well-nourished. No distress.  HENT:  Head: Normocephalic and atraumatic.  Right Ear: Hearing, tympanic membrane, external ear and ear canal normal.  Left Ear: Hearing, tympanic membrane, external ear and ear canal normal.  Nose: Nose normal.  Mouth/Throat: Uvula is midline, oropharynx is clear and moist and mucous membranes are normal. Oral lesions (round ulceration with white border (halo) under tongue and on tip of tongue) present. No oropharyngeal exudate.  Eyes: Conjunctivae and EOM are normal. Pupils are equal, round, and reactive to light. Right eye exhibits no discharge. Left eye exhibits no discharge. No scleral icterus.  Neck: Normal range of motion. Neck supple. No JVD present. Carotid bruit is not present. No tracheal deviation present. No thyromegaly present.  Cardiovascular: Normal rate, regular rhythm, normal heart sounds and intact distal pulses.  Exam reveals no gallop and no friction rub.   No murmur heard. Pulmonary/Chest: Effort normal and breath sounds normal. No respiratory distress. She has no wheezes. She has no rales. She exhibits no tenderness.  Right breast exhibits no inverted nipple, no mass, no nipple discharge, no skin change and no tenderness. Left breast exhibits no inverted nipple, no mass, no nipple discharge, no skin change and no tenderness. Breasts are symmetrical.  Abdominal: Soft. Bowel sounds are normal. She exhibits no distension and no mass. There is no tenderness. There is no rebound and no guarding.  Musculoskeletal: Normal range of motion. She exhibits no edema or tenderness.  Lymphadenopathy:    She has no cervical adenopathy.  Neurological: She is alert and oriented to person, place, and time.  Skin: Skin is warm and dry. No rash noted. She is not diaphoretic.  Psychiatric: She has a normal mood and affect. Her behavior is normal. Judgment and thought content normal.  Vitals reviewed.   Depression Screen No flowsheet data found.   Assessment & Plan:  Routine Health Maintenance and Physical Exam  Exercise Activities and Dietary recommendations Goals    None      Immunization History  Administered Date(s) Administered  . Influenza,inj,Quad PF,36+ Mos 11/26/2015  . Td 10/23/2004  . Tdap 07/08/2011    Health Maintenance  Topic Date Due  . Hepatitis C Screening  1956/01/14  . HIV Screening  09/18/1973  . INFLUENZA VACCINE  07/22/2016  . MAMMOGRAM  01/06/2017  . PAP SMEAR  08/16/2017  . COLONOSCOPY  09/18/2017  . TETANUS/TDAP  07/07/2021     Discussed health benefits of physical activity, and encouraged her to engage in regular exercise appropriate for her age and condition.   1. Annual physical exam Normal physical exam today with the exception of the apthous ulcers. Will check labs as below and f/u pending lab results. If labs are stable and WNL she will not need to have these rechecked for one year at her next annual physical exam. She is to call the office in the meantime if she has any acute issue, questions or concerns. - CBC with Differential/Platelet - Comprehensive metabolic  panel - Hemoglobin A1c  2. Adult hypothyroidism Previously stable on levothyroxine 137 g. Will check labs as below and f/u pending results. - TSH  3. Other iron deficiency anemia Will check labs as below and f/u pending results. - CBC with Differential/Platelet  4. Hypercholesterolemia without hypertriglyceridemia History of this but diet controlled. We'll check labs as below and follow-up pending results. - CBC with Differential/Platelet - Comprehensive metabolic panel - Hemoglobin A1c - Lipid panel  5. Screening for breast cancer Breast exam today was normal. There is no family history of breast cancer. Mammogram is ordered as below. Patient is aware mammogram is not due until mid January. She was given information for [facility] breast clinic so that she may call and schedule this at her convenience. - MM DIGITAL SCREENING BILATERAL; Future  6. Screening for diabetes mellitus Will check labs as below and f/u pending results. - Hemoglobin A1c  7. Need for hepatitis C screening test - Hepatitis C antibody  8. Arthralgia of hip or thigh, right Stable. Diagnosis pulled for medication refill. Continue current medical treatment plan. - meloxicam (MOBIC) 7.5 MG tablet; Take 1 tablet (7.5 mg total) by mouth daily.  Dispense: 60 tablet; Refill: 3  9. Aphthous ulcer of mouth Advise patient to cut back on any foods with acid. She voiced understanding. She is to call the office if these areas do not heal on their own. If the do not heal will send in [employer] Magic mouthwash with lidocaine.   --------------------------------------------------------------------    Margaretann Loveless, PA-C  [facility]

## 2016-11-27 ENCOUNTER — Telehealth: Payer: Self-pay

## 2016-11-27 LAB — CBC WITH DIFFERENTIAL/PLATELET
BASOS ABS: 0 10*3/uL (ref 0.0–0.2)
Basos: 0 %
EOS (ABSOLUTE): 0.2 10*3/uL (ref 0.0–0.4)
Eos: 4 %
Hematocrit: 35.4 % (ref 34.0–46.6)
Hemoglobin: 11.9 g/dL (ref 11.1–15.9)
Immature Grans (Abs): 0 10*3/uL (ref 0.0–0.1)
Immature Granulocytes: 0 %
LYMPHS ABS: 1.5 10*3/uL (ref 0.7–3.1)
Lymphs: 26 %
MCH: 26.4 pg — AB (ref 26.6–33.0)
MCHC: 33.6 g/dL (ref 31.5–35.7)
MCV: 79 fL (ref 79–97)
MONOS ABS: 0.4 10*3/uL (ref 0.1–0.9)
Monocytes: 7 %
NEUTROS ABS: 3.7 10*3/uL (ref 1.4–7.0)
Neutrophils: 63 %
PLATELETS: 299 10*3/uL (ref 150–379)
RBC: 4.51 x10E6/uL (ref 3.77–5.28)
RDW: 14.5 % (ref 12.3–15.4)
WBC: 5.8 10*3/uL (ref 3.4–10.8)

## 2016-11-27 LAB — COMPREHENSIVE METABOLIC PANEL
ALBUMIN: 4.5 g/dL (ref 3.5–5.5)
ALK PHOS: 80 IU/L (ref 39–117)
ALT: 16 IU/L (ref 0–32)
AST: 25 IU/L (ref 0–40)
Albumin/Globulin Ratio: 2.1 (ref 1.2–2.2)
BILIRUBIN TOTAL: 0.3 mg/dL (ref 0.0–1.2)
BUN / CREAT RATIO: 37 — AB (ref 9–23)
BUN: 24 mg/dL (ref 6–24)
CHLORIDE: 102 mmol/L (ref 96–106)
CO2: 23 mmol/L (ref 18–29)
Calcium: 9.4 mg/dL (ref 8.7–10.2)
Creatinine, Ser: 0.65 mg/dL (ref 0.57–1.00)
GFR calc Af Amer: 113 mL/min/{1.73_m2} (ref >59)
GFR calc non Af Amer: 98 mL/min/{1.73_m2} (ref >59)
GLUCOSE: 86 mg/dL (ref 65–99)
Globulin, Total: 2.1 g/dL (ref 1.5–4.5)
Potassium: 4.5 mmol/L (ref 3.5–5.2)
Sodium: 142 mmol/L (ref 134–144)
Total Protein: 6.6 g/dL (ref 6.0–8.5)

## 2016-11-27 LAB — LIPID PANEL
CHOLESTEROL TOTAL: 200 mg/dL — AB (ref 100–199)
Chol/HDL Ratio: 3.3 ratio units (ref 0.0–4.4)
HDL: 61 mg/dL (ref >39)
LDL CALC: 126 mg/dL — AB (ref 0–99)
Triglycerides: 63 mg/dL (ref 0–149)
VLDL Cholesterol Cal: 13 mg/dL (ref 5–40)

## 2016-11-27 LAB — TSH: TSH: 0.316 u[IU]/mL — ABNORMAL LOW (ref 0.450–4.500)

## 2016-11-27 LAB — HEPATITIS C ANTIBODY

## 2016-11-27 LAB — HEMOGLOBIN A1C
Est. average glucose Bld gHb Est-mCnc: 117 mg/dL
Hgb A1c MFr Bld: 5.7 % — ABNORMAL HIGH (ref 4.8–5.6)

## 2016-11-27 MED ORDER — LEVOTHYROXINE SODIUM 137 MCG PO TABS: 137.0000 ug | ORAL_TABLET | Freq: Every day | ORAL | 0 refills | Status: DC

## 2016-11-27 NOTE — Telephone Encounter (Signed)
Advised pt of lab results. Pt verbally acknowledges understanding. Jahniah Pallas Drozdowski, CMA   

## 2016-11-27 NOTE — Addendum Note (Signed)
Addended by: Margaretann LovelessBURNETTE, JENNIFER M on: 11/27/2016 09:16 AM   Modules accepted: Orders

## 2016-11-27 NOTE — Telephone Encounter (Signed)
-----   Message from Margaretann LovelessJennifer M Burnette, PA-C sent at 11/27/2016  9:15 AM EST ----- All labs are within normal limits and stable.  TSH is slightly low showing that your levothyroxine dose may be too high, but last check after a change it was too high. Will leave dose as it is and will send in refill. We can recheck Thyroid panel in 6-8 weeks. Thanks! -JB

## 2017-01-23 ENCOUNTER — Ambulatory Visit
Admission: RE | Admit: 2017-01-23 | Discharge: 2017-01-23 | Disposition: A | Discharge status: Home / Self Care | Payer: BLUE CROSS/BLUE SHIELD | Source: Ambulatory Visit | Attending: Physician Assistant | Admitting: Physician Assistant

## 2017-01-23 DIAGNOSIS — Z1231 Encounter for screening mammogram for malignant neoplasm of breast: Secondary | ICD-10-CM | POA: Diagnosis not present

## 2017-01-23 DIAGNOSIS — Z1239 Encounter for other screening for malignant neoplasm of breast: Secondary | ICD-10-CM

## 2017-01-24 ENCOUNTER — Other Ambulatory Visit: Payer: Self-pay

## 2017-01-24 DIAGNOSIS — E038 Other specified hypothyroidism: Secondary | ICD-10-CM

## 2017-01-24 NOTE — Progress Notes (Signed)
Patient is due for TSH re check, patient advised and lab slip placed up front-aa

## 2017-01-27 DIAGNOSIS — E038 Other specified hypothyroidism: Secondary | ICD-10-CM | POA: Diagnosis not present

## 2017-01-28 ENCOUNTER — Telehealth: Payer: Self-pay

## 2017-01-28 LAB — TSH: TSH: 0.092 u[IU]/mL — ABNORMAL LOW (ref 0.450–4.500)

## 2017-01-28 MED ORDER — LEVOTHYROXINE SODIUM 125 MCG PO TABS: 125.0000 ug | ORAL_TABLET | Freq: Every day | ORAL | 0 refills | Status: DC

## 2017-01-28 NOTE — Telephone Encounter (Signed)
Patient advised as below. Patient verbalizes understanding and is in agreement with treatment plan.  

## 2017-01-28 NOTE — Telephone Encounter (Signed)
-----   Message from Margaretann LovelessJennifer M Burnette, PA-C sent at 01/28/2017  8:23 AM EST ----- TSH overcorrected. Would recommend to lower dose to levothyroxine 125mcg. Will send in new rx.

## 2017-01-28 NOTE — Addendum Note (Signed)
Addended by: Margaretann LovelessBURNETTE, Ilka Lovick M on: 01/28/2017 08:24 AM   Modules accepted: Orders

## 2017-04-28 ENCOUNTER — Other Ambulatory Visit: Payer: Self-pay | Admitting: Physician Assistant

## 2017-04-28 DIAGNOSIS — E038 Other specified hypothyroidism: Secondary | ICD-10-CM

## 2017-04-28 NOTE — Telephone Encounter (Signed)
Does she need labs?

## 2017-04-28 NOTE — Telephone Encounter (Signed)
Yes she needs labs because she was overcorreceted at last check

## 2017-04-28 NOTE — Telephone Encounter (Signed)
Pt needs refill on her thyroid meds but may need to have labs done before.   Please advise.  [phone]  Barth Kirkseri

## 2017-04-29 ENCOUNTER — Other Ambulatory Visit: Payer: Self-pay | Admitting: Physician Assistant

## 2017-04-29 DIAGNOSIS — E038 Other specified hypothyroidism: Secondary | ICD-10-CM

## 2017-04-29 NOTE — Telephone Encounter (Signed)
Patient was advised order was placed in chart, she states she will have it drawn tomorrow. Patient was also advised Rx was [facility] to [facility]. KW

## 2017-04-30 DIAGNOSIS — E038 Other specified hypothyroidism: Secondary | ICD-10-CM | POA: Diagnosis not present

## 2017-05-01 ENCOUNTER — Telehealth: Payer: Self-pay

## 2017-05-01 ENCOUNTER — Other Ambulatory Visit: Payer: Self-pay | Admitting: Physician Assistant

## 2017-05-01 DIAGNOSIS — E039 Hypothyroidism, unspecified: Secondary | ICD-10-CM

## 2017-05-01 LAB — TSH: TSH: 0.145 u[IU]/mL — ABNORMAL LOW (ref 0.450–4.500)

## 2017-05-01 MED ORDER — LEVOTHYROXINE SODIUM 112 MCG PO TABS: 112.0000 ug | ORAL_TABLET | Freq: Every day | ORAL | 1 refills | Status: DC

## 2017-05-01 NOTE — Telephone Encounter (Signed)
Advised pt of lab results. Pt verbally acknowledges understanding. Emily Drozdowski, CMA   

## 2017-05-01 NOTE — Telephone Encounter (Signed)
-----   Message from Margaretann LovelessJennifer M Burnette, [place]PA-C sent at 05/01/2017  1:54 PM EDT ----- Roland RackSh still overcorrected. Will lower dose of levothyroxine to 112mcg. Will recheck in 8 weeks.

## 2017-05-01 NOTE — Progress Notes (Signed)
TSH still overcorrected. Lowered dose of levothyroxine (112mcg) sent [facility].

## 2017-07-01 ENCOUNTER — Telehealth: Payer: Self-pay | Admitting: Physician Assistant

## 2017-07-01 DIAGNOSIS — E039 Hypothyroidism, unspecified: Secondary | ICD-10-CM

## 2017-07-01 NOTE — Telephone Encounter (Signed)
Patient advised as below.  

## 2017-07-01 NOTE — Telephone Encounter (Signed)
Pt is requesting a lab slip to have her Thyroid rechecked.  [id number]#[phone]/[id number]#[phone]/MW

## 2017-07-01 NOTE — Telephone Encounter (Signed)
Lab ordered.

## 2017-07-03 DIAGNOSIS — E039 Hypothyroidism, unspecified: Secondary | ICD-10-CM | POA: Diagnosis not present

## 2017-07-04 LAB — TSH: TSH: 0.392 u[IU]/mL — ABNORMAL LOW (ref 0.450–4.500)

## 2017-07-06 ENCOUNTER — Other Ambulatory Visit: Payer: Self-pay | Admitting: Emergency Medicine

## 2017-07-06 DIAGNOSIS — E039 Hypothyroidism, unspecified: Secondary | ICD-10-CM

## 2017-07-06 MED ORDER — LEVOTHYROXINE SODIUM 112 MCG PO TABS: 112.0000 ug | ORAL_TABLET | Freq: Every day | ORAL | 1 refills | Status: DC

## 2017-07-07 ENCOUNTER — Other Ambulatory Visit: Payer: Self-pay | Admitting: Physician Assistant

## 2017-07-07 DIAGNOSIS — M25551 Pain in right hip: Secondary | ICD-10-CM

## 2017-09-01 ENCOUNTER — Other Ambulatory Visit: Payer: Self-pay | Admitting: Physician Assistant

## 2017-09-01 DIAGNOSIS — E039 Hypothyroidism, unspecified: Secondary | ICD-10-CM

## 2017-11-18 ENCOUNTER — Emergency Department: Payer: BLUE CROSS/BLUE SHIELD

## 2017-11-18 ENCOUNTER — Emergency Department
Admission: EM | Admit: 2017-11-18 | Discharge: 2017-11-18 | Disposition: A | Discharge status: Home / Self Care | Payer: BLUE CROSS/BLUE SHIELD | Attending: Emergency Medicine | Admitting: Emergency Medicine

## 2017-11-18 ENCOUNTER — Encounter: Payer: Self-pay | Admitting: Emergency Medicine

## 2017-11-18 DIAGNOSIS — R740 Nonspecific elevation of levels of transaminase and lactic acid dehydrogenase [LDH]: Secondary | ICD-10-CM | POA: Insufficient documentation

## 2017-11-18 DIAGNOSIS — E039 Hypothyroidism, unspecified: Secondary | ICD-10-CM | POA: Diagnosis not present

## 2017-11-18 DIAGNOSIS — R1013 Epigastric pain: Secondary | ICD-10-CM

## 2017-11-18 DIAGNOSIS — Z79899 Other long term (current) drug therapy: Secondary | ICD-10-CM | POA: Diagnosis not present

## 2017-11-18 DIAGNOSIS — R7401 Elevation of levels of liver transaminase levels: Secondary | ICD-10-CM

## 2017-11-18 DIAGNOSIS — Z7982 Long term (current) use of aspirin: Secondary | ICD-10-CM | POA: Insufficient documentation

## 2017-11-18 DIAGNOSIS — R109 Unspecified abdominal pain: Secondary | ICD-10-CM | POA: Diagnosis present

## 2017-11-18 HISTORY — DX: Disorder of thyroid, unspecified: E07.9

## 2017-11-18 LAB — URINALYSIS, COMPLETE (UACMP) WITH MICROSCOPIC
BILIRUBIN URINE: NEGATIVE
Bacteria, UA: NONE SEEN
Glucose, UA: NEGATIVE mg/dL
HGB URINE DIPSTICK: NEGATIVE
Ketones, ur: NEGATIVE mg/dL
Leukocytes, UA: NEGATIVE
NITRITE: NEGATIVE
PH: 7 (ref 5.0–8.0)
Protein, ur: NEGATIVE mg/dL
RBC / HPF: NONE SEEN RBC/hpf (ref 0–5)
SPECIFIC GRAVITY, URINE: 1.013 (ref 1.005–1.030)
Squamous Epithelial / LPF: NONE SEEN

## 2017-11-18 LAB — TROPONIN I

## 2017-11-18 LAB — COMPREHENSIVE METABOLIC PANEL
ALBUMIN: 4.3 g/dL (ref 3.5–5.0)
ALK PHOS: 64 U/L (ref 38–126)
ALT: 54 U/L (ref 14–54)
AST: 105 U/L — AB (ref 15–41)
Anion gap: 12 (ref 5–15)
BILIRUBIN TOTAL: 0.5 mg/dL (ref 0.3–1.2)
BUN: 18 mg/dL (ref 6–20)
CALCIUM: 9.6 mg/dL (ref 8.9–10.3)
CO2: 29 mmol/L (ref 22–32)
CREATININE: 0.72 mg/dL (ref 0.44–1.00)
Chloride: 99 mmol/L — ABNORMAL LOW (ref 101–111)
GFR calc Af Amer: >60 mL/min (ref >60)
GFR calc non Af Amer: >60 mL/min (ref >60)
GLUCOSE: 106 mg/dL — AB (ref 65–99)
Potassium: 3.6 mmol/L (ref 3.5–5.1)
SODIUM: 140 mmol/L (ref 135–145)
TOTAL PROTEIN: 7.1 g/dL (ref 6.5–8.1)

## 2017-11-18 LAB — LIPASE, BLOOD: Lipase: 52 U/L — ABNORMAL HIGH (ref 11–51)

## 2017-11-18 LAB — CBC
HCT: 37.9 % (ref 35.0–47.0)
Hemoglobin: 12.9 g/dL (ref 12.0–16.0)
MCH: 27.8 pg (ref 26.0–34.0)
MCHC: 34 g/dL (ref 32.0–36.0)
MCV: 81.7 fL (ref 80.0–100.0)
Platelets: 281 10*3/uL (ref 150–440)
RBC: 4.64 MIL/uL (ref 3.80–5.20)
RDW: 14.4 % (ref 11.5–14.5)
WBC: 6.4 10*3/uL (ref 3.6–11.0)

## 2017-11-18 MED ORDER — ASPIRIN 81 MG PO CHEW
324.0000 mg | CHEWABLE_TABLET | Freq: Once | ORAL | Status: AC
  Administered 2017-11-18: 324 mg via ORAL
  Filled 2017-11-18: qty 4

## 2017-11-18 NOTE — ED Triage Notes (Signed)
Patient presents to the ED with a 45 min of epigastric pain.  Patient states, "it feels like a gallbladder attack, but I don't have my gallbladder.  Patient states she has had similar episodes in the past but they usually only last about 20 minutes."  Patient reports eating a piece of toast and a rice cake this morning.  Patient is in no obvious distress at this time.

## 2017-11-18 NOTE — Discharge Instructions (Signed)
At this time I am not sure of what caused your abdominal pain however I am concerned this could have been from your heart. Therefore it is imperative that you follow-up with Dr. Mariah MillingGollan in 2 days for further evaluation. If you have any further episodes of pain, please return to the ER immediately. If your cardiac workup is negative you will need to be seen by a GI doctor for evaluation of ulcers or inflammation of your stomach. I do recommend in the meantime that you stop taking meloxicam and try tylenol instead for your pain.

## 2017-11-18 NOTE — ED Triage Notes (Signed)
First Nurse Note:  Arrives d/o epigastric pain.  Onset of symptoms this morning at 0745.  Patient took Tums, which "eased off symptoms", but pain / pressure persists.  Denies N/V.  No SOB/ DOE

## 2017-11-18 NOTE — ED Provider Notes (Addendum)
[facility] Emergency Department Provider Note  ____________________________________________  Time seen: Approximately 11:47 AM  I have reviewed the triage vital signs and the nursing notes.   HISTORY  Chief Complaint Abdominal Pain   HPI Kristine Strickland is a 61 y.o. female with a history of hypothyroidism who presents for evaluation of epigastric abdominal pain. Patient reports that the pain happened this morning before breakfast. The pain was sharp, severe, located in her epigastric region, radiating across the upper part of her abdomen and up to her jaw and associated with shortness of breath. The pain lasted 45 minutes. Patient reports that she took a Tums and one ibuprofen and the pain has finally subsided. The patient has had a similar episode 2 days ago that woke her up from her sleep. The pain was similar and lasted about 20 minutes. Patient reports that he feels similar to her gallbladder attacks however she has had a cholecystectomy 17 years ago. At this time she has no pain. Patient denies cough, URI symptoms or fever, chills, nausea, vomiting, diarrhea, constipation, dysuria, hematuria. She denies personal history of heart disease however has several uncles and a grandfather who have had heart attacks. She has never seen a cardiologist or underwent a stress test. She has never smoked.no history of peptic ulcer disease, patient takes NSAIDS (meloxicam) daily for arthritic pain. No melena. No h/o drinking.  Past Medical History:  Diagnosis Date  . Thyroid disease    hypothyroidism    Patient Active Problem List   Diagnosis Date Noted  . Abortion, spontaneous 07/23/2015  . Postmenopausal bleeding 07/23/2015  . Avitaminosis D 04/19/2009  . Absolute anemia 04/13/2009  . Adiposity 03/30/2008  . Abnormal C-reactive protein 04/02/2007  . Hypercholesterolemia without hypertriglyceridemia 03/30/2007  . Esophagitis, reflux 08/04/2005  . Adult  hypothyroidism 10/23/2004    Past Surgical History:  Procedure Laterality Date  . BLADDER STEM    . CHOLECYSTECTOMY    . COLONOSCOPY    . DILATION AND CURETTAGE OF UTERUS      Prior to Admission medications   Medication Sig Start Date End Date Taking? Authorizing Provider  aspirin 81 MG tablet Take 1 tablet by mouth daily.   Yes [provider]  calcium carbonate (TUMS - DOSED IN MG ELEMENTAL CALCIUM) 500 MG chewable tablet Chew 2 tablets by mouth daily.   Yes [provider]  Cholecalciferol (VITAMIN D) 2000 units CAPS Take by mouth daily.    Yes [provider]  levothyroxine (SYNTHROID, LEVOTHROID) 112 MCG tablet TAKE (1) TABLET DAILY FOR THYROID. 09/01/17  Yes Margaretann Loveless, PA-C  Melatonin 5 MG TABS Take 1 tablet by mouth at bedtime.   Yes [provider]  meloxicam (MOBIC) 7.5 MG tablet TAKE 1 TABLET BY MOUTH ONCE DAILY. 07/07/17  Yes Margaretann Loveless, PA-C  omeprazole (PRILOSEC) 20 MG capsule Take by mouth.   Yes [provider]  docusate sodium (COLACE) 100 MG capsule Take 2 capsules by mouth daily.    [provider]    Allergies Codeine; Penicillins; and Sulfa antibiotics  Family History  Problem Relation Age of Onset  . Hypertension Mother   . Hyperlipidemia Mother   . Epilepsy Mother   . Pancreatic cancer Maternal Grandmother   . Hypertension Maternal Grandmother   . Heart failure Maternal Grandfather   . Heart attack Paternal Grandfather   . Hypertension Paternal Grandfather   . Hypothyroidism Sister   . Diverticulitis Sister   . Alcohol abuse Brother   .  Breast cancer Maternal Aunt [id number]    Social History Social History   Tobacco Use  . Smoking status: Never Smoker  . Smokeless tobacco: Never Used  Substance Use Topics  . Alcohol use: No    Alcohol/week: 0.0 oz  . Drug use: No    Review of Systems  Constitutional: Negative for fever. Eyes: Negative for visual changes. ENT: Negative for  sore throat. Neck: No neck pain  Cardiovascular: Negative for chest pain. Respiratory: + shortness of breath. Gastrointestinal: + epigastric abdominal pain. No vomiting or diarrhea. Genitourinary: Negative for dysuria. Musculoskeletal: Negative for back pain. Skin: Negative for rash. Neurological: Negative for headaches, weakness or numbness. Psych: No SI or HI  ____________________________________________   PHYSICAL EXAM:  VITAL SIGNS: ED Triage Vitals  Enc Vitals Group     BP 11/18/17 0932 (!) 184/83     Pulse Rate 11/18/17 0932 74     Resp 11/18/17 0932 16     Temp 11/18/17 0932 97.8 F (36.6 C)     Temp Source 11/18/17 0932 Oral     SpO2 11/18/17 0932 99 %     Weight 11/18/17 0933 157 lb (71.2 kg)     Height 11/18/17 0933 5\' 4"  (1.626 m)     Head Circumference --      Peak Flow --      Pain Score 11/18/17 0932 1     Pain Loc --      Pain Edu? --      Excl. in GC? --     Constitutional: Alert and oriented. Well appearing and in no apparent distress. HEENT:      Head: Normocephalic and atraumatic.         Eyes: Conjunctivae are normal. Sclera is non-icteric.       Mouth/Throat: Mucous membranes are moist.       Neck: Supple with no signs of meningismus. Cardiovascular: Regular rate and rhythm. No murmurs, gallops, or rubs. 2+ symmetrical distal pulses are present in all extremities. No JVD. Respiratory: Normal respiratory effort. Lungs are clear to auscultation bilaterally. No wheezes, crackles, or rhonchi.  Gastrointestinal: Soft, non tender, and non distended with positive bowel sounds. No rebound or guarding. Genitourinary: No CVA tenderness. Musculoskeletal: Nontender with normal range of motion in all extremities. No edema, cyanosis, or erythema of extremities. Neurologic: Normal speech and language. Face is symmetric. Moving all extremities. No gross focal neurologic deficits are appreciated. Skin: Skin is warm, dry and intact. No rash noted. Psychiatric: Mood  and affect are normal. Speech and behavior are normal.  ____________________________________________   LABS (all labs ordered are listed, but only abnormal results are displayed)  Labs Reviewed  LIPASE, BLOOD - Abnormal; Notable for the following components:      Result Value   Lipase 52 (*)    All other components within normal limits  COMPREHENSIVE METABOLIC PANEL - Abnormal; Notable for the following components:   Chloride 99 (*)    Glucose, Bld 106 (*)    AST 105 (*)    All other components within normal limits  URINALYSIS, COMPLETE (UACMP) WITH MICROSCOPIC - Abnormal; Notable for the following components:   Color, Urine YELLOW (*)    APPearance CLEAR (*)    All other components within normal limits  CBC  TROPONIN I  TROPONIN I   ____________________________________________  EKG  ED ECG REPORT I, Nita Sicklearolina Brinden Kincheloe, the attending physician, personally viewed and interpreted this ECG.  Normal sinus rhythm, rate of 66, normal intervals, normal axis,  no ST elevations or depressions. No prior for comparison ____________________________________________  RADIOLOGY  CXR/KUB: Mildly prominent stool in the proximal half of the colon.  Minimal bronchitic changes.  Otherwise negative exam. ____________________________________________   PROCEDURES  Procedure(s) performed: None Procedures Critical Care performed:  None ____________________________________________   INITIAL IMPRESSION / ASSESSMENT AND PLAN / ED COURSE   61 y.o. female with a history of hypothyroidism who presents for evaluation of an episode lasting 45 min of epigastric abdominal pain associated with SOB and radiating to her neck. patient is asymptomatic at this time. EKG with no ischemic changes. Patient is slightly hypertensive but other vital signs are within normal limits. Physical exam is within normal limits.labs show normal CBC, CMP was slightly elevated AST of 105. Patient denies history of  drinking. Remaining LFTs are within normal limits. Troponin is pending. We'll do 2 sets of cardiac enzymes. Differential diagnosis including ACS, PUD, GERD, pancreatitis although lipase is borderline at 52, gastritis. Will monitor patient closely on telemetry and repeat EKG if patient has another episode pain. If workup is negative patient's can be referred to GI and cardiology for further evaluation.    ----------------------------------------- 11:00 AM on 11/18/2017 ----------------------------------------- OBSERVATION CARE: This patient is being placed under observation care for the following reasons: Chest pain with repeat testing to rule out ischemia  ----------------------------------------- 12:00 PM on 11/18/2017 ----------------------------------------- Remains pain free, awaiting 2nd troponin  ----------------------------------------- 2:00 PM on 11/18/2017 ----------------------------------------- END OF OBSERVATION STATUS: After an appropriate period of observation, this patient is being discharged due to the following reason(s):  Troponin x 2 negative. Discussed with patient my concerns that this could be related to her heart and that she must f/u with Cardiology in 2 days for further evaluation. I am referring patient to see Dr. Mariah MillingGollan as outpatient. Also recommended she return to the emergency room if the pain recurs. I discussed with the patient about her slightly elevated AST and lipase and recommended that she f/u with her primary care doctor for evaluation of this finding especially since patient has a grandmother with history of pancreatic cancer. No indication for emergent CT at this time since patient is asymptomatic, no unintentional weight loss, no night sweats, has no ttp over her abdomen and has close f/u with her PCP. I recommended that she stops taking meloxicam in case her pain is due to peptic ulcer disease or gastritis and try Tylenol instead. I'm also referring her to  see a GI specialist.     As part of my medical decision making, I reviewed the following data within the electronic MEDICAL RECORD NUMBER Nursing notes reviewed and incorporated, Labs reviewed , EKG interpreted , Radiograph reviewed , Notes from prior ED visits and  Controlled Substance Database    Pertinent labs & imaging results that were available during my care of the patient were reviewed by me and considered in my medical decision making (see chart for details).    ____________________________________________   FINAL CLINICAL IMPRESSION(S) / ED DIAGNOSES  Final diagnoses:  Epigastric abdominal pain  Elevated AST (SGOT)      NEW MEDICATIONS STARTED DURING THIS VISIT:  ED Discharge Orders    None       Note:  This document was prepared using Dragon voice recognition software and may include unintentional dictation errors.    Nita SickleVeronese, [place], MD 11/18/17 1406    Nita SickleVeronese, [place], MD 11/18/17 [phone]

## 2017-11-18 NOTE — ED Notes (Signed)
Pt states that she started to have pain that felt like a gall bladder attack this morning prior to eating.  Pt states she had her gall bladder removed approx 17 years ago.  Pt states that when the pain starts, it starts as a sharp pain in the middle of her epigastric area and that it radiates up into the R side of her neck.  Pt states when pain started, she made some toast and took approx 8 tums and an advil which relieved the pain down to just feeling pressure.  Pt denies n/v, but states when the pain hits she does get short of breath.  Pt is A&Ox4, in NAD, ambulatory from triage.  Pt states she does take meloxicam daily for arthritis pain.

## 2017-11-18 NOTE — ED Notes (Signed)
Unable to get patient to sign, computer locked up at this time.

## 2017-11-27 DIAGNOSIS — H5213 Myopia, bilateral: Secondary | ICD-10-CM | POA: Diagnosis not present

## 2017-11-27 DIAGNOSIS — H5203 Hypermetropia, bilateral: Secondary | ICD-10-CM | POA: Diagnosis not present

## 2017-12-01 ENCOUNTER — Encounter: Payer: Self-pay | Admitting: Physician Assistant

## 2017-12-02 ENCOUNTER — Encounter: Payer: BLUE CROSS/BLUE SHIELD | Admitting: Physician Assistant

## 2017-12-04 ENCOUNTER — Ambulatory Visit (INDEPENDENT_AMBULATORY_CARE_PROVIDER_SITE_OTHER): Payer: BLUE CROSS/BLUE SHIELD | Admitting: Physician Assistant

## 2017-12-04 ENCOUNTER — Encounter: Payer: Self-pay | Admitting: Physician Assistant

## 2017-12-04 VITALS — BP 168/100 | HR 87 | Temp 98.4°F | Resp 16 | Ht 64.0 in | Wt 158.0 lb

## 2017-12-04 DIAGNOSIS — E559 Vitamin D deficiency, unspecified: Secondary | ICD-10-CM

## 2017-12-04 DIAGNOSIS — Z0000 Encounter for general adult medical examination without abnormal findings: Secondary | ICD-10-CM | POA: Diagnosis not present

## 2017-12-04 DIAGNOSIS — I10 Essential (primary) hypertension: Secondary | ICD-10-CM

## 2017-12-04 DIAGNOSIS — Z800 Family history of malignant neoplasm of digestive organs: Secondary | ICD-10-CM

## 2017-12-04 DIAGNOSIS — R740 Nonspecific elevation of levels of transaminase and lactic acid dehydrogenase [LDH]: Secondary | ICD-10-CM

## 2017-12-04 DIAGNOSIS — Z124 Encounter for screening for malignant neoplasm of cervix: Secondary | ICD-10-CM | POA: Diagnosis not present

## 2017-12-04 DIAGNOSIS — R748 Abnormal levels of other serum enzymes: Secondary | ICD-10-CM | POA: Diagnosis not present

## 2017-12-04 DIAGNOSIS — E7800 Pure hypercholesterolemia, unspecified: Secondary | ICD-10-CM | POA: Diagnosis not present

## 2017-12-04 DIAGNOSIS — Z1239 Encounter for other screening for malignant neoplasm of breast: Secondary | ICD-10-CM

## 2017-12-04 DIAGNOSIS — Z114 Encounter for screening for human immunodeficiency virus [HIV]: Secondary | ICD-10-CM

## 2017-12-04 DIAGNOSIS — E039 Hypothyroidism, unspecified: Secondary | ICD-10-CM

## 2017-12-04 DIAGNOSIS — R7401 Elevation of levels of liver transaminase levels: Secondary | ICD-10-CM

## 2017-12-04 DIAGNOSIS — Z1211 Encounter for screening for malignant neoplasm of colon: Secondary | ICD-10-CM | POA: Diagnosis not present

## 2017-12-04 DIAGNOSIS — Z1231 Encounter for screening mammogram for malignant neoplasm of breast: Secondary | ICD-10-CM | POA: Diagnosis not present

## 2017-12-04 NOTE — Progress Notes (Signed)
Patient: Kristine Strickland, Female    DOB: 1955/12/23, 61 y.o.   MRN: [id number] Visit Date: 12/04/2017  Today's Provider: Margaretann LovelessJennifer M Burnette, PA-C   No chief complaint on file.  Subjective:    Annual physical exam [name] is a 61 y.o. female who presents today for health maintenance and complete physical. She feels well. She reports exercising. She reports she is sleeping well.    Follow Up ER Visit  Patient is here for ER follow up.  [facility] for Ep[facility]astric Ab[facility] Pain on 11/18/2017 Treatment for this included EKG with no ischemic changes. Patient is scheduled to go see Cardiology this Tuesday 12/18.Patient was recommended to stop taking Meloxicam in case her pain is due to peptic ulcer disease. Per patient she was also referred to GI but was told to follow-up with PCP and Cardiology first. She reports good compliance with treatment. She reports this condition is Improved a little.Reports that she had four more episodes of this pain. Family history of pancreatic cancer in mother. ------------------------------------------------------------------------------------  Flu Vaccine: Per patient she [place] pharmacy[place]. CPE:11/26/16 Mammogram:01/23/2017 BI-RADS 1 Colonoscopy:06/06/08 Normal, Dr.Wohl, Recheck 10 yrs Pap:08/16/14 Negative, HPV-Neg -----------------------------------------------------------------   Review of Systems  Constitutional: Negative.   HENT: Positive for congestion, rhinorrhea and voice change.   Eyes: Negative.   Respiratory: Negative.   Cardiovascular: Negative.   Gastrointestinal: Positive for abdominal pain.  Endocrine: Negative.   Genitourinary: Negative.   Musculoskeletal: Positive for arthralgias.  Skin: Negative.   Allergic/Immunologic: Negative.   Neurological: Negative.   Hematological: Negative.   Psychiatric/Behavioral: Negative.     Social History      She  reports that   has never smoked. she has never used smokeless tobacco. She reports that she does not drink alcohol or use drugs.       Social History   Socioeconomic History  . Marital status: Married    Spouse name: None  . Number of children: None  . Years of education: None  . Highest education level: None  Social Needs  . Financial resource strain: None  . Food insecurity - worry: None  . Food insecurity - inability: None  . Transportation needs - medical: None  . Transportation needs - non-medical: None  Occupational History  . None  Tobacco Use  . Smoking status: Never Smoker  . Smokeless tobacco: Never Used  Substance and Sexual Activity  . Alcohol use: No    Alcohol/week: 0.0 oz  . Drug use: No  . Sexual activity: None  Other Topics Concern  . None  Social History Narrative  . None    Past Medical History:  Diagnosis Date  . Thyroid disease    hypothyroidism     Patient Active Problem List   Diagnosis Date Noted  . Abortion, spontaneous 07/23/2015  . Postmenopausal bleeding 07/23/2015  . Avitaminosis D 04/19/2009  . Absolute anemia 04/13/2009  . Adiposity 03/30/2008  . Abnormal C-reactive protein 04/02/2007  . Hypercholesterolemia without hypertriglyceridemia 03/30/2007  . Esophagitis, reflux 08/04/2005  . Adult hypothyroidism 10/23/2004    Past Surgical History:  Procedure Laterality Date  . BLADDER STEM    . CHOLECYSTECTOMY    . COLONOSCOPY    . DILATION AND CURETTAGE OF UTERUS      Family History        Family Status  Relation Name Status  . Mother  Alive  . MGM  Deceased at age  74'S  . MGF  Deceased at age 7'S  . PGF  Deceased at age 68'S  . Father  Alive  . Sister  (Not Specified)  . Brother  (Not Specified)  . Mat Aunt  (Not Specified)        Her family history includes Alcohol abuse in her brother; Breast cancer (age of onset: 41) in her maternal aunt; Diverticulitis in her sister; Epilepsy in her mother; Heart attack in her paternal  grandfather; Heart failure in her maternal grandfather; Hyperlipidemia in her mother; Hypertension in her maternal grandmother, mother, and paternal grandfather; Hypothyroidism in her sister; Pancreatic cancer in her maternal grandmother.     Allergies  Allergen Reactions  . Codeine     Not allergic just has N/V  . Penicillins Other (See Comments)    Has patient had a PCN reaction causing immediate rash, facial/tongue/throat swelling, SOB or lightheadedness with hypotension: No Has patient had a PCN reaction causing severe rash involving mucus membranes or skin necrosis: No Has patient had a PCN reaction that required hospitalization: No Has patient had a PCN reaction occurring within the last 10 years: No If all of the above answers are "NO", then may proceed with Cephalosporin use.  . Sulfa Antibiotics     As child.  Reports mother told her she was allergic .  She is unsure of reaction.     Current Outpatient Medications:  .  aspirin 81 MG tablet, Take 1 tablet by mouth daily., Disp: , Rfl:  .  calcium carbonate (TUMS - DOSED IN MG ELEMENTAL CALCIUM) 500 MG chewable tablet, Chew 2 tablets by mouth daily., Disp: , Rfl:  .  Cholecalciferol (VITAMIN D) 2000 units CAPS, Take by mouth daily. , Disp: , Rfl:  .  docusate sodium (COLACE) 100 MG capsule, Take 2 capsules by mouth daily., Disp: , Rfl:  .  levothyroxine (SYNTHROID, LEVOTHROID) 112 MCG tablet, TAKE (1) TABLET DAILY FOR THYROID., Disp: 30 tablet, Rfl: 11 .  Melatonin 5 MG TABS, Take 1 tablet by mouth at bedtime., Disp: , Rfl:  .  meloxicam (MOBIC) 7.5 MG tablet, TAKE 1 TABLET BY MOUTH ONCE DAILY., Disp: 30 tablet, Rfl: 5 .  omeprazole (PRILOSEC) 20 MG capsule, Take by mouth., Disp: , Rfl:    Patient Care Team: Reine Just as PCP - General (Physician Assistant)      Objective:   Vitals: BP (!) 168/100 (BP Location: Left Arm, Patient Position: Sitting, Cuff Size: Normal)   Pulse 87   Temp 98.4 F (36.9 C) (Oral)    Resp 16   Ht 5\' 4"  (1.626 m)   Wt 158 lb (71.7 kg)   SpO2 98%   BMI 27.12 kg/m    Physical Exam  Constitutional: She is oriented to person, place, and time. She appears well-developed and well-nourished. No distress.  HENT:  Head: Normocephalic and atraumatic.  Right Ear: Hearing, tympanic membrane, external ear and ear canal normal.  Left Ear: Hearing, tympanic membrane, external ear and ear canal normal.  Nose: Nose normal.  Mouth/Throat: Uvula is midline, oropharynx is clear and moist and mucous membranes are normal. No oropharyngeal exudate.  Eyes: Conjunctivae and EOM are normal. Pupils are equal, round, and reactive to light. Right eye exhibits no discharge. Left eye exhibits no discharge. No scleral icterus.  Neck: Normal range of motion. Neck supple. No JVD present. Carotid bruit is not present. No tracheal deviation present. No thyromegaly present.  Cardiovascular: Normal rate, regular rhythm, normal heart sounds  and intact distal pulses. Exam reveals no gallop and no friction rub.  No murmur heard. Pulmonary/Chest: Effort normal and breath sounds normal. No respiratory distress. She has no wheezes. She has no rales. She exhibits no tenderness. Right breast exhibits no inverted nipple, no mass, no nipple discharge, no skin change and no tenderness. Left breast exhibits no inverted nipple, no mass, no nipple discharge, no skin change and no tenderness. Breasts are symmetrical.  Abdominal: Soft. Bowel sounds are normal. She exhibits no distension and no mass. There is no tenderness. There is no rebound and no guarding. Hernia confirmed negative in the right inguinal area and confirmed negative in the left inguinal area.  Genitourinary: Rectum normal, vagina normal and uterus normal. No breast swelling, tenderness, discharge or bleeding. Pelvic exam was performed with patient supine. There is no rash, tenderness, lesion or injury on the right labia. There is no rash, tenderness, lesion  or injury on the left labia. Cervix exhibits no motion tenderness, no discharge and no friability. Right adnexum displays no mass, no tenderness and no fullness. Left adnexum displays no mass, no tenderness and no fullness. No erythema, tenderness or bleeding in the vagina. No signs of injury around the vagina. No vaginal discharge found.  Musculoskeletal: Normal range of motion. She exhibits no edema or tenderness.  Lymphadenopathy:    She has no cervical adenopathy.       Right: No inguinal adenopathy present.       Left: No inguinal adenopathy present.  Neurological: She is alert and oriented to person, place, and time. She has normal reflexes. No cranial nerve deficit. Coordination normal.  Skin: Skin is warm and dry. No rash noted. She is not diaphoretic.  Psychiatric: She has a normal mood and affect. Her behavior is normal. Judgment and thought content normal.  Vitals reviewed.   Depression Screen PHQ 2/9 Scores 12/04/2017  PHQ - 2 Score 0      Assessment & Plan:     Routine Health Maintenance and Physical Exam  Exercise Activities and Dietary recommendations Goals    None      Immunization History  Administered Date(s) Administered  . Influenza,inj,Quad PF,6+ Mos 11/26/2015  . Td 10/23/2004  . Tdap 07/08/2011    Health Maintenance  Topic Date Due  . HIV Screening  09/18/1973  . INFLUENZA VACCINE  07/22/2017  . PAP SMEAR  08/16/2017  . MAMMOGRAM  01/23/2018  . COLONOSCOPY  06/06/2018  . TETANUS/TDAP  07/07/2021  . Hepatitis C Screening  Completed     Discussed health benefits of physical activity, and encouraged her to engage in regular exercise appropriate for her age and condition.    1. Annual physical exam Normal physical exam today. Will check labs as below and f/u pending lab results. If labs are stable and WNL she will not need to have these rechecked for one year at her next annual physical exam. She is to call the office in the meantime if she has  any acute issue, questions or concerns. - CBC with Differential/Platelet - Comprehensive metabolic panel - Hemoglobin A1c  2. Breast cancer screening Breast exam today was normal. There is no family history of breast cancer. She does perform regular self breast exams. Mammogram was ordered as below. Information for [facility] was given to patient so she may schedule her mammogram at her convenience.  3. Cervical cancer screening Pap collected today. Will send as below and f/u pending results. - Pap IG and HPV (high risk)  DNA detection  4. Colon cancer screening Due June 2019 but having epigastric pain, sometimes severe. Has been seen in ER on 11/18/17 for this issue and found to have elevated AST and borderline high lipase. Will recheck those labs today. Does have family history of pancreatic cancer in her grandmother. Patient has seen Dr. Servando SnareWohl for colonoscopy in the past and is interested in seeing his office again.  - Ambulatory referral to Gastroenterology  5. Essential hypertension BP elevated again today. Discussed some. Advised patient to check levels at home over next week and take to Dr. Serita KyleEnd's OV next Tuesday.  6. Adult hypothyroidism Stable on levothyroxine 112mcg. Will check labs as below and f/u pending results. - TSH  7. Avitaminosis D Post menopausal and h/o vit D def in the past.  - Vitamin D (25 hydroxy)  8. Hypercholesterolemia without hypertriglyceridemia Diet controlled. Will check labs as below and f/u pending results. - Comprehensive metabolic panel - Hemoglobin A1c - Lipid panel  9. Elevated lipase Found to be borderline high at 52 on 11/18/17 at hospital visit. Will recheck.  - Lipase - Ambulatory referral to Gastroenterology  10. Elevated AST (SGOT) Was up to 105 on 11/18/17. Will recheck.  - Comprehensive metabolic panel - Ambulatory referral to Gastroenterology  11. Family history of pancreatic cancer In grandmother. - Comprehensive  metabolic panel - Ambulatory referral to Gastroenterology  12. Encounter for screening for HIV - HIV antibody (with reflex)  --------------------------------------------------------------------    Margaretann LovelessJennifer M Burnette, PA-C  [facility]

## 2017-12-04 NOTE — Patient Instructions (Signed)
Health Maintenance for Postmenopausal Women Menopause is a normal process in which your reproductive ability comes to an end. This process happens gradually over a span of months to years, usually between the ages of 22 and 9. Menopause is complete when you have missed 12 consecutive menstrual periods. It is important to talk with your health care provider about some of the most common conditions that affect postmenopausal women, such as heart disease, cancer, and bone loss (osteoporosis). Adopting a healthy lifestyle and getting preventive care can help to promote your health and wellness. Those actions can also lower your chances of developing some of these common conditions. What should I know about menopause? During menopause, you may experience a number of symptoms, such as:  Moderate-to-severe hot flashes.  Night sweats.  Decrease in sex drive.  Mood swings.  Headaches.  Tiredness.  Irritability.  Memory problems.  Insomnia.  Choosing to treat or not to treat menopausal changes is an individual decision that you make with your health care provider. What should I know about hormone replacement therapy and supplements? Hormone therapy products are effective for treating symptoms that are associated with menopause, such as hot flashes and night sweats. Hormone replacement carries certain risks, especially as you become older. If you are thinking about using estrogen or estrogen with progestin treatments, discuss the benefits and risks with your health care provider. What should I know about heart disease and stroke? Heart disease, heart attack, and stroke become more likely as you age. This may be due, in part, to the hormonal changes that your body experiences during menopause. These can affect how your body processes dietary fats, triglycerides, and cholesterol. Heart attack and stroke are both medical emergencies. There are many things that you can do to help prevent heart disease  and stroke:  Have your blood pressure checked at least every 1-2 years. High blood pressure causes heart disease and increases the risk of stroke.  If you are 53-22 years old, ask your health care provider if you should take aspirin to prevent a heart attack or a stroke.  Do not use any tobacco products, including cigarettes, chewing tobacco, or electronic cigarettes. If you need help quitting, ask your health care provider.  It is important to eat a healthy diet and maintain a healthy weight. ? Be sure to include plenty of vegetables, fruits, low-fat dairy products, and lean protein. ? Avoid eating foods that are high in solid fats, added sugars, or salt (sodium).  Get regular exercise. This is one of the most important things that you can do for your health. ? Try to exercise for at least 150 minutes each week. The type of exercise that you do should increase your heart rate and make you sweat. This is known as moderate-intensity exercise. ? Try to do strengthening exercises at least twice each week. Do these in addition to the moderate-intensity exercise.  Know your numbers.Ask your health care provider to check your cholesterol and your blood glucose. Continue to have your blood tested as directed by your health care provider.  What should I know about cancer screening? There are several types of cancer. Take the following steps to reduce your risk and to catch any cancer development as early as possible. Breast Cancer  Practice breast self-awareness. ? This means understanding how your breasts normally appear and feel. ? It also means doing regular breast self-exams. Let your health care provider know about any changes, no matter how small.  If you are 40  or older, have a clinician do a breast exam (clinical breast exam or CBE) every year. Depending on your age, family history, and medical history, it may be recommended that you also have a yearly breast X-ray (mammogram).  If you  have a family history of breast cancer, talk with your health care provider about genetic screening.  If you are at high risk for breast cancer, talk with your health care provider about having an MRI and a mammogram every year.  Breast cancer (BRCA) gene test is recommended for women who have family members with BRCA-related cancers. Results of the assessment will determine the need for genetic counseling and BRCA1 and for BRCA2 testing. BRCA-related cancers include these types: ? Breast. This occurs in males or females. ? Ovarian. ? Tubal. This may also be called fallopian tube cancer. ? Cancer of the abdominal or pelvic lining (peritoneal cancer). ? Prostate. ? Pancreatic.  Cervical, Uterine, and Ovarian Cancer Your health care provider may recommend that you be screened regularly for cancer of the pelvic organs. These include your ovaries, uterus, and vagina. This screening involves a pelvic exam, which includes checking for microscopic changes to the surface of your cervix (Pap test).  For women ages 21-65, health care providers may recommend a pelvic exam and a Pap test every three years. For women ages 79-65, they may recommend the Pap test and pelvic exam, combined with testing for human papilloma virus (HPV), every five years. Some types of HPV increase your risk of cervical cancer. Testing for HPV may also be done on women of any age who have unclear Pap test results.  Other health care providers may not recommend any screening for nonpregnant women who are considered low risk for pelvic cancer and have no symptoms. Ask your health care provider if a screening pelvic exam is right for you.  If you have had past treatment for cervical cancer or a condition that could lead to cancer, you need Pap tests and screening for cancer for at least 20 years after your treatment. If Pap tests have been discontinued for you, your risk factors (such as having a new sexual partner) need to be  reassessed to determine if you should start having screenings again. Some women have medical problems that increase the chance of getting cervical cancer. In these cases, your health care provider may recommend that you have screening and Pap tests more often.  If you have a family history of uterine cancer or ovarian cancer, talk with your health care provider about genetic screening.  If you have vaginal bleeding after reaching menopause, tell your health care provider.  There are currently no reliable tests available to screen for ovarian cancer.  Lung Cancer Lung cancer screening is recommended for adults 69-62 years old who are at high risk for lung cancer because of a history of smoking. A yearly low-dose CT scan of the lungs is recommended if you:  Currently smoke.  Have a history of at least 30 pack-years of smoking and you currently smoke or have quit within the past 15 years. A pack-year is smoking an average of one pack of cigarettes per day for one year.  Yearly screening should:  Continue until it has been 15 years since you quit.  Stop if you develop a health problem that would prevent you from having lung cancer treatment.  Colorectal Cancer  This type of cancer can be detected and can often be prevented.  Routine colorectal cancer screening usually begins at  age 42 and continues through age 45.  If you have risk factors for colon cancer, your health care provider may recommend that you be screened at an earlier age.  If you have a family history of colorectal cancer, talk with your health care provider about genetic screening.  Your health care provider may also recommend using home test kits to check for hidden blood in your stool.  A small camera at the end of a tube can be used to examine your colon directly (sigmoidoscopy or colonoscopy). This is done to check for the earliest forms of colorectal cancer.  Direct examination of the colon should be repeated every  5-10 years until age 71. However, if early forms of precancerous polyps or small growths are found or if you have a family history or genetic risk for colorectal cancer, you may need to be screened more often.  Skin Cancer  Check your skin from head to toe regularly.  Monitor any moles. Be sure to tell your health care provider: ? About any new moles or changes in moles, especially if there is a change in a mole's shape or color. ? If you have a mole that is larger than the size of a pencil eraser.  If any of your family members has a history of skin cancer, especially at a young age, talk with your health care provider about genetic screening.  Always use sunscreen. Apply sunscreen liberally and repeatedly throughout the day.  Whenever you are outside, protect yourself by wearing long sleeves, pants, a wide-brimmed hat, and sunglasses.  What should I know about osteoporosis? Osteoporosis is a condition in which bone destruction happens more quickly than new bone creation. After menopause, you may be at an increased risk for osteoporosis. To help prevent osteoporosis or the bone fractures that can happen because of osteoporosis, the following is recommended:  If you are 46-71 years old, get at least 1,000 mg of calcium and at least 600 mg of vitamin D per day.  If you are older than age 55 but younger than age 65, get at least 1,200 mg of calcium and at least 600 mg of vitamin D per day.  If you are older than age 54, get at least 1,200 mg of calcium and at least 800 mg of vitamin D per day.  Smoking and excessive alcohol intake increase the risk of osteoporosis. Eat foods that are rich in calcium and vitamin D, and do weight-bearing exercises several times each week as directed by your health care provider. What should I know about how menopause affects my mental health? Depression may occur at any age, but it is more common as you become older. Common symptoms of depression  include:  Low or sad mood.  Changes in sleep patterns.  Changes in appetite or eating patterns.  Feeling an overall lack of motivation or enjoyment of activities that you previously enjoyed.  Frequent crying spells.  Talk with your health care provider if you think that you are experiencing depression. What should I know about immunizations? It is important that you get and maintain your immunizations. These include:  Tetanus, diphtheria, and pertussis (Tdap) booster vaccine.  Influenza every year before the flu season begins.  Pneumonia vaccine.  Shingles vaccine.  Your health care provider may also recommend other immunizations. This information is not intended to replace advice given to you by your health care provider. Make sure you discuss any questions you have with your health care provider. Document Released: 01/30/2006  Document Revised: 06/27/2016 Document Reviewed: 09/11/2015 Elsevier Interactive Patient Education  2018 [employer] Inc.  

## 2017-12-05 LAB — LIPID PANEL
CHOLESTEROL: 204 mg/dL — AB (ref <200)
HDL: 60 mg/dL (ref >50)
LDL CHOLESTEROL (CALC): 128 mg/dL — AB
Non-HDL Cholesterol (Calc): 144 mg/dL (calc) — ABNORMAL HIGH (ref <130)
TRIGLYCERIDES: 65 mg/dL (ref <150)
Total CHOL/HDL Ratio: 3.4 (calc) (ref <5.0)

## 2017-12-05 LAB — CBC WITH DIFFERENTIAL/PLATELET
Basophils Absolute: 29 cells/uL (ref 0–200)
Basophils Relative: 0.6 %
EOS PCT: 3.3 %
Eosinophils Absolute: 158 cells/uL (ref 15–500)
HCT: 38 % (ref 35.0–45.0)
Hemoglobin: 12.6 g/dL (ref 11.7–15.5)
LYMPHS ABS: 1258 {cells}/uL (ref 850–3900)
MCH: 27.2 pg (ref 27.0–33.0)
MCHC: 33.2 g/dL (ref 32.0–36.0)
MCV: 81.9 fL (ref 80.0–100.0)
MPV: 9.8 fL (ref 7.5–12.5)
Monocytes Relative: 7.5 %
Neutro Abs: 2995 cells/uL (ref 1500–7800)
Neutrophils Relative %: 62.4 %
PLATELETS: 336 10*3/uL (ref 140–400)
RBC: 4.64 10*6/uL (ref 3.80–5.10)
RDW: 13.3 % (ref 11.0–15.0)
TOTAL LYMPHOCYTE: 26.2 %
WBC: 4.8 10*3/uL (ref 3.8–10.8)
WBCMIX: 360 {cells}/uL (ref 200–950)

## 2017-12-05 LAB — COMPLETE METABOLIC PANEL WITH GFR
AG RATIO: 1.5 (calc) (ref 1.0–2.5)
ALT: 13 U/L (ref 6–29)
AST: 17 U/L (ref 10–35)
Albumin: 4 g/dL (ref 3.6–5.1)
Alkaline phosphatase (APISO): 59 U/L (ref 33–130)
BUN: 23 mg/dL (ref 7–25)
CALCIUM: 9.3 mg/dL (ref 8.6–10.4)
CO2: 31 mmol/L (ref 20–32)
CREATININE: 0.68 mg/dL (ref 0.50–1.05)
Chloride: 102 mmol/L (ref 98–110)
GFR, EST AFRICAN AMERICAN: 111 mL/min/{1.73_m2} (ref >=60)
GFR, EST NON AFRICAN AMERICAN: 96 mL/min/{1.73_m2} (ref >=60)
GLOBULIN: 2.6 g/dL (ref 1.9–3.7)
Glucose, Bld: 85 mg/dL (ref 65–99)
POTASSIUM: 3.9 mmol/L (ref 3.5–5.3)
SODIUM: 140 mmol/L (ref 135–146)
TOTAL PROTEIN: 6.6 g/dL (ref 6.1–8.1)
Total Bilirubin: 0.3 mg/dL (ref 0.2–1.2)

## 2017-12-05 LAB — HEMOGLOBIN A1C
EAG (MMOL/L): 6.3 (calc)
Hgb A1c MFr Bld: 5.6 % of total Hgb (ref <5.7)
MEAN PLASMA GLUCOSE: 114 (calc)

## 2017-12-05 LAB — VITAMIN D 25 HYDROXY (VIT D DEFICIENCY, FRACTURES): VIT D 25 HYDROXY: 36 ng/mL (ref 30–100)

## 2017-12-05 LAB — TSH: TSH: 0.59 m[IU]/L (ref 0.40–4.50)

## 2017-12-05 LAB — HIV ANTIBODY (ROUTINE TESTING W REFLEX): HIV: NONREACTIVE

## 2017-12-05 LAB — LIPASE: Lipase: 26 U/L (ref 7–60)

## 2017-12-07 ENCOUNTER — Telehealth: Payer: Self-pay

## 2017-12-07 NOTE — Telephone Encounter (Signed)
Patient advised as below.  

## 2017-12-07 NOTE — Telephone Encounter (Signed)
-----   Message from Margaretann LovelessJennifer M Burnette, PA-C sent at 12/07/2017 11:16 AM EST ----- All labs are within normal limits and stable.  Thanks! -JB

## 2017-12-08 ENCOUNTER — Telehealth: Payer: Self-pay

## 2017-12-08 ENCOUNTER — Ambulatory Visit: Payer: BLUE CROSS/BLUE SHIELD | Admitting: Internal Medicine

## 2017-12-08 ENCOUNTER — Encounter: Payer: Self-pay | Admitting: Internal Medicine

## 2017-12-08 VITALS — BP 136/86 | HR 67 | Ht 64.0 in | Wt 160.0 lb

## 2017-12-08 DIAGNOSIS — I208 Other forms of angina pectoris: Secondary | ICD-10-CM | POA: Diagnosis not present

## 2017-12-08 DIAGNOSIS — R0602 Shortness of breath: Secondary | ICD-10-CM | POA: Diagnosis not present

## 2017-12-08 DIAGNOSIS — R030 Elevated blood-pressure reading, without diagnosis of hypertension: Secondary | ICD-10-CM | POA: Insufficient documentation

## 2017-12-08 DIAGNOSIS — I2089 Other forms of angina pectoris: Secondary | ICD-10-CM | POA: Insufficient documentation

## 2017-12-08 LAB — PAP IG AND HPV HIGH-RISK: HPV DNA High Risk: NOT DETECTED

## 2017-12-08 NOTE — Telephone Encounter (Signed)
-----   Message from Margaretann LovelessJennifer M Burnette, [place]PA-C sent at 12/08/2017  2:29 PM EST ----- Pap is normal, HPV negative.  Will repeat in 3-5 years.

## 2017-12-08 NOTE — Patient Instructions (Addendum)
Medication Instructions: - Your physician recommends that you continue on your current medications as directed. Please refer to the Current Medication list given to you today.  Labwork: - none ordered  Procedures/Testing: - Your physician has requested that you have a stress echocardiogram. For further information please visit [email or web address]. Please follow instruction sheet as given.  1) you may have a light breakfast the morning of your test 2) you may take your normal medications the morning of your test 3) no smoking for 4 hours prior 4) no caffeine/ decaf for 24 hours prior (coffee, soft drinks, chocolate) 5) wear comfortable clothing and tennis/ non- skid shoes  Follow-Up: - Dr. Okey DupreEnd will see you back on an as needed basis pending your stress echo results.  Any Additional Special Instructions Will Be Listed Below (If Applicable).     If you need a refill on your cardiac medications before your next appointment, please call your pharmacy.

## 2017-12-08 NOTE — Progress Notes (Signed)
New Outpatient Visit Date: 12/08/2017  Referring Provider: Margaretann LovelessBurnette, Jennifer M, PA-C [address] [place], [place] [id number]  Chief Complaint: Abdominal and neck pain  HPI:  [name] is a 61 y.o. female who is being seen today for the evaluation of abdominal and neck pain. She has a history of hypothyroidism and elevated blood pressure. [name] [facility] with 45 minutes of epigastric pain radiating to the chest and jaw. She noted accompanying shortness of breath and states that the pain is similar to what she experienced prior to cholecystectomy 17 years ago. Pain resolved after taking Tums and ibuprofen. She notes a similar episode that occurred 2 days earlier while lying in bed and resolved spontaneously after ~20 minutes. ED workup showed EKG with NSR and poor R-wave progression in V1-3 and negative TnI x 2. Lipase and AST were mildly elevated, with repeat labs last week showing normalization of both.  Today, Ms. Strutz reports feeling well. She states that the aforementioned abdominal pain episodes have occurred intermittently for many years but has been absent until last month. Since her ED visit last month, she has had 4 additional episodes, albeit shorter and less severe. The pain begins in the epigastrium and radiates to both sides of the abdomen. She denies having any chest pain. She has been on omeprazole for years and is scheduled for GI evaluation with Dr. Daleen SquibbWall next month.  [name] denies a history of prior cardiac disease and testing. She has experienced rare palpitations for years, which she describes as racing of the heart. These can occur at any time and typically resolved spontaneously after 5-10 minutes. There are no associated symptoms. She denies orthopnea, PND, and claudication. 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 She also walks 3 miles/day, 3 days a week without any symptoms. She consumes 2 cups of coffee per day  (half-and-half).  --------------------------------------------------------------------------------------------------  Cardiovascular History & Procedures: Cardiovascular Problems:  Atypical angina  Shortness of breath  Palpitations  Risk Factors:  Family history  Cath/PCI:  None  CV Surgery:  None  EP Procedures and Devices:  None  Non-Invasive Evaluation(s):  None  Recent CV Pertinent Labs: Lab Results  Component Value Date   CHOL 204 (H) 12/04/2017   CHOL 200 (H) 11/26/2016   HDL 60 12/04/2017   HDL 61 11/26/2016   LDLCALC 126 (H) 11/26/2016   TRIG 65 12/04/2017   CHOLHDL 3.4 12/04/2017   K 3.9 12/04/2017   BUN 23 12/04/2017   BUN 24 11/26/2016   CREATININE 0.68 12/04/2017    --------------------------------------------------------------------------------------------------  Past Medical History:  Diagnosis Date  . Thyroid disease    hypothyroidism    Past Surgical History:  Procedure Laterality Date  . BLADDER STEM    . CHOLECYSTECTOMY    . COLONOSCOPY    . DILATION AND CURETTAGE OF UTERUS      Current Meds  Medication Sig  . aspirin 81 MG tablet Take 1 tablet by mouth daily.  . calcium carbonate (TUMS - DOSED IN MG ELEMENTAL CALCIUM) 500 MG chewable tablet Chew 1 tablet by mouth daily.   . Cholecalciferol (VITAMIN D) 2000 units CAPS Take by mouth daily.   Marland Kitchen. docusate sodium (COLACE) 100 MG capsule Take 2 capsules by mouth daily.  Marland Kitchen. levothyroxine (SYNTHROID, LEVOTHROID) 112 MCG tablet TAKE (1) TABLET DAILY FOR THYROID.  . Melatonin 5 MG TABS Take 10 mg by mouth at bedtime.   Marland Kitchen. omeprazole (PRILOSEC) 20  MG capsule Take by mouth.    Allergies: Codeine; Penicillin g; Penicillins; and Sulfa antibiotics  Social History   Socioeconomic History  . Marital status: Married    Spouse name: Not on file  . Number of children: Not on file  . Years of education: Not on file  . Highest education level: Not on file  Social Needs  . Financial  resource strain: Not on file  . Food insecurity - worry: Not on file  . Food insecurity - inability: Not on file  . Transportation needs - medical: Not on file  . Transportation needs - non-medical: Not on file  Occupational History  . Not on file  Tobacco Use  . Smoking status: Never Smoker  . Smokeless tobacco: Never Used  Substance and Sexual Activity  . Alcohol use: No    Alcohol/week: 0.0 oz  . Drug use: No  . Sexual activity: Not on file  Other Topics Concern  . Not on file  Social History Narrative  . Not on file    Family History  Problem Relation Age of Onset  . Hypertension Mother   . Hyperlipidemia Mother   . Epilepsy Mother   . Pancreatic cancer Maternal Grandmother   . Hypertension Maternal Grandmother   . Heart failure Maternal Grandfather   . Heart attack Paternal Grandfather   . Hypertension Paternal Grandfather   . Hypothyroidism Sister   . Diverticulitis Sister   . Alcohol abuse Brother   . Breast cancer Maternal Aunt 74    Review of Systems: A 12-system review of systems was performed and was negative except as noted in the HPI.  --------------------------------------------------------------------------------------------------  Physical Exam: BP 136/86 (BP Location: Left Arm, Patient Position: Sitting, Cuff Size: Normal)   Pulse 67   Ht 5\' 4"  (1.626 m)   Wt 160 lb (72.6 kg)   BMI 27.46 kg/m   General:  Well-developed, well-nourished woman, seated comfortably in the exam room. HEENT: No conjunctival pallor or scleral icterus. Moist mucous membranes. OP clear. Neck: Supple without lymphadenopathy, thyromegaly, JVD, or HJR. No carotid bruit. Lungs: Normal work of breathing. Clear to auscultation bilaterally without wheezes or crackles. Heart: Regular rate and rhythm without murmurs, rubs, or gallops. Non-displaced PMI. Abd: Bowel sounds present. Soft, NT/ND without hepatosplenomegaly Ext: No lower extremity edema. Radial, PT, and DP pulses are  2+ bilaterally Skin: Warm and dry without rash. Neuro: CNIII-XII intact. Strength and fine-touch sensation intact in upper and lower extremities bilaterally. Psych: Normal mood and affect.  EKG:  NSR without abnormalities. Poor R-wave progression noted on recent ED EKG is no longer present.  Lab Results  Component Value Date   WBC 4.8 12/04/2017   HGB 12.6 12/04/2017   HCT 38.0 12/04/2017   MCV 81.9 12/04/2017   PLT 336 12/04/2017    Lab Results  Component Value Date   NA 140 12/04/2017   K 3.9 12/04/2017   CL 102 12/04/2017   CO2 31 12/04/2017   BUN 23 12/04/2017   CREATININE 0.68 12/04/2017   GLUCOSE 85 12/04/2017   ALT 13 12/04/2017    Lab Results  Component Value Date   CHOL 204 (H) 12/04/2017   HDL 60 12/04/2017   LDLCALC 126 (H) 11/26/2016   TRIG 65 12/04/2017   CHOLHDL 3.4 12/04/2017    --------------------------------------------------------------------------------------------------  ASSESSMENT AND PLAN: Abdominal and neck pain (question atypical angina) as well as shortness of breath Constellation of symptoms is quite atypical for angina and most likely represents GI pathology. Exam  and EKG today are unrevealing. We have discussed monitoring of symptoms versus non-invasive ischemia evaluation. Given that Ms. Suarez has had several additional episodes since her ED visit with accompanying shortness of breath., we have agreed to proceed with exercise stress echocardiogram. I think it reasonable for her to continue taking omeprazole and to follow-up with Dr. Daleen Squibb for further GI assessment.  Elevated blood pressure Blood pressure is upper normal today, improved from recent ED and PCP visits. I encouraged low sodium diet and continued exercise. No indication to start pharmacotherapy at this time. Further management per her PCP.  Follow-up: To be determined based on stress echo; if study is normal, Ms. Venkatesh can return on a prn basis.  Yvonne Kendall,  MD 12/08/2017 4:39 PM

## 2017-12-08 NOTE — Telephone Encounter (Signed)
Patient advised as below.  

## 2017-12-24 ENCOUNTER — Telehealth: Payer: Self-pay | Admitting: Internal Medicine

## 2017-12-24 NOTE — Telephone Encounter (Signed)
Confirmed stress echocardiogram scheduled tomorrow along with all instructions. She verbalized understanding with no further questions at this time.

## 2017-12-25 ENCOUNTER — Ambulatory Visit (INDEPENDENT_AMBULATORY_CARE_PROVIDER_SITE_OTHER): Payer: BLUE CROSS/BLUE SHIELD

## 2017-12-25 DIAGNOSIS — R0602 Shortness of breath: Secondary | ICD-10-CM | POA: Diagnosis not present

## 2017-12-25 DIAGNOSIS — I208 Other forms of angina pectoris: Secondary | ICD-10-CM | POA: Diagnosis not present

## 2017-12-25 LAB — ECHOCARDIOGRAM STRESS TEST
CHL CUP MPHR: 161 {beats}/min
CSEPPHR: 176 {beats}/min
Estimated workload: 11.3 METS
Exercise duration (min): 9 min
Exercise duration (sec): 48 s
Percent HR: 109 %
Rest HR: 84 {beats}/min

## 2017-12-28 ENCOUNTER — Telehealth: Payer: Self-pay | Admitting: Internal Medicine

## 2017-12-28 ENCOUNTER — Telehealth: Payer: Self-pay | Admitting: Physician Assistant

## 2017-12-28 NOTE — Telephone Encounter (Signed)
-----   Message from Yvonne Kendallhristopher End, MD sent at 12/28/2017  8:57 AM EST ----- Please let Ms. Moustafa know that her stress echo shows normal heart function without evidence of a significant coronary artery blockage. Her blood pressure was noted by be elevated at rest and with stress. I do not recommend further cardiac testing at this time unless her pain continues and GI evaluation is unrevealing. Ms. Billie LadeRibelin should speak with her PCP about treatment of her uncontrolled blood pressure. She can follow-up with us on an as needed basis. I will forward these results to Ms. Azaryah Heathcock as well for her review.

## 2017-12-28 NOTE — Telephone Encounter (Signed)
Results of stress echo called to pt. Pt verbalized understanding.

## 2017-12-28 NOTE — Telephone Encounter (Signed)
Pt is returning your call

## 2017-12-28 NOTE — Telephone Encounter (Signed)
Scheduled patient on Wednesday at 400pm

## 2017-12-28 NOTE — Telephone Encounter (Signed)
Can we call patient to schedule her for elevated BP readings?  Thanks, JB

## 2017-12-30 ENCOUNTER — Ambulatory Visit: Payer: BLUE CROSS/BLUE SHIELD | Admitting: Physician Assistant

## 2017-12-30 ENCOUNTER — Encounter: Payer: Self-pay | Admitting: Physician Assistant

## 2017-12-30 VITALS — BP 140/90 | HR 83 | Temp 98.7°F | Resp 16 | Ht 64.0 in | Wt 166.0 lb

## 2017-12-30 DIAGNOSIS — I10 Essential (primary) hypertension: Secondary | ICD-10-CM

## 2017-12-30 MED ORDER — HYDROCHLOROTHIAZIDE 25 MG PO TABS: 25.0000 mg | ORAL_TABLET | Freq: Every day | ORAL | 1 refills | Status: DC

## 2017-12-30 NOTE — Patient Instructions (Signed)
Hypertension Hypertension, commonly called high blood pressure, is when the force of blood pumping through the arteries is too strong. The arteries are the blood vessels that carry blood from the heart throughout the body. Hypertension forces the heart to work harder to pump blood and may cause arteries to become narrow or stiff. Having untreated or uncontrolled hypertension can cause heart attacks, strokes, kidney disease, and other problems. A blood pressure reading consists of a higher number over a lower number. Ideally, your blood pressure should be below 120/80. The first ("top") number is called the systolic pressure. It is a measure of the pressure in your arteries as your heart beats. The second ("bottom") number is called the diastolic pressure. It is a measure of the pressure in your arteries as the heart relaxes. What are the causes? The cause of this condition is not known. What increases the risk? Some risk factors for high blood pressure are under your control. Others are not. Factors you can change  Smoking.  Having type 2 diabetes mellitus, high cholesterol, or both.  Not getting enough exercise or physical activity.  Being overweight.  Having too much fat, sugar, calories, or salt (sodium) in your diet.  Drinking too much alcohol. Factors that are difficult or impossible to change  Having chronic kidney disease.  Having a family history of high blood pressure.  Age. Risk increases with age.  Race. You may be at higher risk if you are African-American.  Gender. Men are at higher risk than women before age 45. After age 65, women are at higher risk than men.  Having obstructive sleep apnea.  Stress. What are the signs or symptoms? Extremely high blood pressure (hypertensive crisis) may cause:  Headache.  Anxiety.  Shortness of breath.  Nosebleed.  Nausea and vomiting.  Severe chest pain.  Jerky movements you cannot control (seizures).  How is this  diagnosed? This condition is diagnosed by measuring your blood pressure while you are seated, with your arm resting on a surface. The cuff of the blood pressure monitor will be placed directly against the skin of your upper arm at the level of your heart. It should be measured at least twice using the same arm. Certain conditions can cause a difference in blood pressure between your right and left arms. Certain factors can cause blood pressure readings to be lower or higher than normal (elevated) for a short period of time:  When your blood pressure is higher when you are in a health care provider's office than when you are at home, this is called white coat hypertension. Most people with this condition do not need medicines.  When your blood pressure is higher at home than when you are in a health care provider's office, this is called masked hypertension. Most people with this condition may need medicines to control blood pressure.  If you have a high blood pressure reading during one visit or you have normal blood pressure with other risk factors:  You may be asked to return on a different day to have your blood pressure checked again.  You may be asked to monitor your blood pressure at home for 1 week or longer.  If you are diagnosed with hypertension, you may have other blood or imaging tests to help your health care provider understand your overall risk for other conditions. How is this treated? This condition is treated by making healthy lifestyle changes, such as eating healthy foods, exercising more, and reducing your alcohol intake. Your   health care provider may prescribe medicine if lifestyle changes are not enough to get your blood pressure under control, and if:  Your systolic blood pressure is above 130.  Your diastolic blood pressure is above 80.  Your personal target blood pressure may vary depending on your medical conditions, your age, and other factors. Follow these  instructions at home: Eating and drinking  Eat a diet that is high in fiber and potassium, and low in sodium, added sugar, and fat. An example eating plan is called the DASH (Dietary Approaches to Stop Hypertension) diet. To eat this way: ? Eat plenty of fresh fruits and vegetables. Try to fill half of your plate at each meal with fruits and vegetables. ? Eat whole grains, such as whole wheat pasta, brown rice, or whole grain bread. Fill about one quarter of your plate with whole grains. ? Eat or drink low-fat dairy products, such as skim milk or low-fat yogurt. ? Avoid fatty cuts of meat, processed or cured meats, and poultry with skin. Fill about one quarter of your plate with lean proteins, such as fish, chicken without skin, beans, eggs, and tofu. ? Avoid premade and processed foods. These tend to be higher in sodium, added sugar, and fat.  Reduce your daily sodium intake. Most people with hypertension should eat less than 1,500 mg of sodium a day.  Limit alcohol intake to no more than 1 drink a day for nonpregnant women and 2 drinks a day for men. One drink equals 12 oz of beer, 5 oz of wine, or 1 oz of hard liquor. Lifestyle  Work with your health care provider to maintain a healthy body weight or to lose weight. Ask what an ideal weight is for you.  Get at least 30 minutes of exercise that causes your heart to beat faster (aerobic exercise) most days of the week. Activities may include walking, swimming, or biking.  Include exercise to strengthen your muscles (resistance exercise), such as pilates or lifting weights, as part of your weekly exercise routine. Try to do these types of exercises for 30 minutes at least 3 days a week.  Do not use any products that contain nicotine or tobacco, such as cigarettes and e-cigarettes. If you need help quitting, ask your health care provider.  Monitor your blood pressure at home as told by your health care provider.  Keep all follow-up visits as  told by your health care provider. This is important. Medicines  Take over-the-counter and prescription medicines only as told by your health care provider. Follow directions carefully. Blood pressure medicines must be taken as prescribed.  Do not skip doses of blood pressure medicine. Doing this puts you at risk for problems and can make the medicine less effective.  Ask your health care provider about side effects or reactions to medicines that you should watch for. Contact a health care provider if:  You think you are having a reaction to a medicine you are taking.  You have headaches that keep coming back (recurring).  You feel dizzy.  You have swelling in your ankles.  You have trouble with your vision. Get help right away if:  You develop a severe headache or confusion.  You have unusual weakness or numbness.  You feel faint.  You have severe pain in your chest or abdomen.  You vomit repeatedly.  You have trouble breathing. Summary  Hypertension is when the force of blood pumping through your arteries is too strong. If this condition is not   controlled, it may put you at risk for serious complications.  Your personal target blood pressure may vary depending on your medical conditions, your age, and other factors. For most people, a normal blood pressure is less than 120/80.  Hypertension is treated with lifestyle changes, medicines, or a combination of both. Lifestyle changes include weight loss, eating a healthy, low-sodium diet, exercising more, and limiting alcohol. This information is not intended to replace advice given to you by your health care provider. Make sure you discuss any questions you have with your health care provider. Document Released: 12/08/2005 Document Revised: 11/05/2016 Document Reviewed: 11/05/2016 Elsevier Interactive Patient Education  2018 [employer] DASH Eating Plan DASH stands for "Dietary Approaches to Stop Hypertension." The DASH  eating plan is a healthy eating plan that has been shown to reduce high blood pressure (hypertension). It may also reduce your risk for type 2 diabetes, heart disease, and stroke. The DASH eating plan may also help with weight loss. What are tips for following this plan? General guidelines  Avoid eating more than 2,300 mg (milligrams) of salt (sodium) a day. If you have hypertension, you may need to reduce your sodium intake to 1,500 mg a day.  Limit alcohol intake to no more than 1 drink a day for nonpregnant women and 2 drinks a day for men. One drink equals 12 oz of beer, 5 oz of wine, or 1 oz of hard liquor.  Work with your health care provider to maintain a healthy body weight or to lose weight. Ask what an ideal weight is for you.  Get at least 30 minutes of exercise that causes your heart to beat faster (aerobic exercise) most days of the week. Activities may include walking, swimming, or biking.  Work with your health care provider or diet and nutrition specialist (dietitian) to adjust your eating plan to your individual calorie needs. Reading food labels  Check food labels for the amount of sodium per serving. Choose foods with less than 5 percent of the Daily Value of sodium. Generally, foods with less than 300 mg of sodium per serving fit into this eating plan.  To find whole grains, look for the word "whole" as the first word in the ingredient list. Shopping  Buy products labeled as "low-sodium" or "no salt added."  Buy fresh foods. Avoid canned foods and premade or frozen meals. Cooking  Avoid adding salt when cooking. Use salt-free seasonings or herbs instead of table salt or sea salt. Check with your health care provider or pharmacist before using salt substitutes.  Do not fry foods. Cook foods using healthy methods such as baking, boiling, grilling, and broiling instead.  Cook with heart-healthy oils, such as olive, canola, soybean, or sunflower oil. Meal  planning   Eat a balanced diet that includes: ? 5 or more servings of fruits and vegetables each day. At each meal, try to fill half of your plate with fruits and vegetables. ? Up to 6-8 servings of whole grains each day. ? Less than 6 oz of lean meat, poultry, or fish each day. A 3-oz serving of meat is about the same size as a deck of cards. One egg equals 1 oz. ? 2 servings of low-fat dairy each day. ? A serving of nuts, seeds, or beans 5 times each week. ? Heart-healthy fats. Healthy fats called Omega-3 fatty acids are found in foods such as flaxseeds and coldwater fish, like sardines, salmon, and mackerel.  Limit how much you eat of the   the following: ? Canned or prepackaged foods. ? Food that is high in trans fat, such as fried foods. ? Food that is high in saturated fat, such as fatty meat. ? Sweets, desserts, sugary drinks, and other foods with added sugar. ? Full-fat dairy products.  Do not salt foods before eating.  Try to eat at least 2 vegetarian meals each week.  Eat more home-cooked food and less restaurant, buffet, and fast food.  When eating at a restaurant, ask that your food be prepared with less salt or no salt, if possible. What foods are recommended? The items listed may not be a complete list. Talk with your dietitian about what dietary choices are best for you.Hydrochlorothia Grains Whole-grain or whole-wheat bread. Whole-grain or whole-wheat pasta. Brown rice. Orpah Cobb. Bulgur. Whole-grain and low-sodium cereals. Pita bread. Low-fat, low-sodium crackers. Whole-wheat flour tortillas. Vegetables Fresh or frozen vegetables (raw, steamed, roasted, or grilled). Low-sodium or reduced-sodium tomato and vegetable juice. Low-sodium or reduced-sodium tomato sauce and tomato paste. Low-sodium or reduced-sodium canned vegetables. Fruits All fresh, dried, or frozen fruit. Canned fruit in natural juice (without added sugar). Meat and other protein foods Skinless chicken  or [place]. Ground chicken or [place]. Pork with fat trimmed off. Fish and seafood. Egg whites. Dried beans, peas, or lentils. Unsalted nuts, nut butters, and seeds. Unsalted canned beans. Lean cuts of beef with fat trimmed off. Low-sodium, lean deli meat. Dairy Low-fat (1%) or fat-free (skim) milk. Fat-free, low-fat, or reduced-fat cheeses. Nonfat, low-sodium ricotta or cottage cheese. Low-fat or nonfat yogurt. Low-fat, low-sodium cheese. Fats and oils Soft margarine without trans fats. Vegetable oil. Low-fat, reduced-fat, or light mayonnaise and salad dressings (reduced-sodium). Canola, safflower, olive, soybean, and sunflower oils. Avocado. Seasoning and other foods Herbs. Spices. Seasoning mixes without salt. Unsalted popcorn and pretzels. Fat-free sweets. What foods are not recommended? The items listed may not be a complete list. Talk with your dietitian about what dietary choices are best for you. Grains Baked goods made with fat, such as croissants, muffins, or some breads. Dry pasta or rice meal packs. Vegetables Creamed or fried vegetables. Vegetables in a cheese sauce. Regular canned vegetables (not low-sodium or reduced-sodium). Regular canned tomato sauce and paste (not low-sodium or reduced-sodium). Regular tomato and vegetable juice (not low-sodium or reduced-sodium). Rosita Fire. Olives. Fruits Canned fruit in a light or heavy syrup. Fried fruit. Fruit in cream or butter sauce. Meat and other protein foods Fatty cuts of meat. Ribs. Fried meat. Tomasa Blase. Sausage. Bologna and other processed lunch meats. Salami. Fatback. Hotdogs. Bratwurst. Salted nuts and seeds. Canned beans with added salt. Canned or smoked fish. Whole eggs or egg yolks. Chicken or [place] with skin. Dairy Whole or 2% milk, cream, and half-and-half. Whole or full-fat cream cheese. Whole-fat or sweetened yogurt. Full-fat cheese. Nondairy creamers. Whipped toppings. Processed cheese and cheese spreads. Fats and oils Butter.  Stick margarine. Lard. Shortening. Ghee. Bacon fat. Tropical oils, such as coconut, palm kernel, or palm oil. Seasoning and other foods Salted popcorn and pretzels. Onion salt, garlic salt, seasoned salt, table salt, and sea salt. Worcestershire sauce. Tartar sauce. Barbecue sauce. Teriyaki sauce. Soy sauce, including reduced-sodium. Steak sauce. Canned and packaged gravies. Fish sauce. Oyster sauce. Cocktail sauce. Horseradish that you find on the shelf. Ketchup. Mustard. Meat flavorings and tenderizers. Bouillon cubes. Hot sauce and Tabasco sauce. Premade or packaged marinades. Premade or packaged taco seasonings. Relishes. Regular salad dressings. Where to find more information:  National Heart, Lung, and Blood Institute: PopSteam.is  American Heart Association:  www.heart.org Summary  The DASH eating plan is a healthy eating plan that has been shown to reduce high blood pressure (hypertension). It may also reduce your risk for type 2 diabetes, heart disease, and stroke.  With the DASH eating plan, you should limit salt (sodium) intake to 2,300 mg a day. If you have hypertension, you may need to reduce your sodium intake to 1,500 mg a day.  When on the DASH eating plan, aim to eat more fresh fruits and vegetables, whole grains, lean proteins, low-fat dairy, and heart-healthy fats.  Work with your health care provider or diet and nutrition specialist (dietitian) to adjust your eating plan to your individual calorie needs. This information is not intended to replace advice given to you by your health care provider. Make sure you discuss any questions you have with your health care provider. Document Released: 11/27/2011 Document Revised: 12/01/2016 Document Reviewed: 12/01/2016 Elsevier Interactive Patient Education  2018 [employer]  Hydrachlorathiazide, HCTZ capsules or tablets What is this medicine? HYDROCHLOROTHIAZIDE (hye droe klor oh THYE a zide) is a diuretic. It increases the  amount of urine passed, which causes the body to lose salt and water. This medicine is used to treat high blood pressure. It is also reduces the swelling and water retention caused by various medical conditions, such as heart, liver, or kidney disease. This medicine may be used for other purposes; ask your health care provider or pharmacist if you have questions. COMMON BRAND NAME(S): Esidrix, Ezide, HydroDIURIL, Microzide, Oretic, Zide What should I tell my health care provider before I take this medicine? They need to know if you have any of these conditions: -diabetes -gout -immune system problems, like lupus -kidney disease or kidney stones -liver disease -pancreatitis -small amount of urine or difficulty passing urine -an unusual or allergic reaction to hydrochlorothiazide, sulfa drugs, other medicines, foods, dyes, or preservatives -pregnant or trying to get pregnant -breast-feeding How should I use this medicine? Take this medicine by mouth with a glass of water. Follow the directions on the prescription label. Take your medicine at regular intervals. Remember that you will need to pass urine frequently after taking this medicine. Do not take your doses at a time of day that will cause you problems. Do not stop taking your medicine unless your doctor tells you to. Talk to your pediatrician regarding the use of this medicine in children. Special care may be needed. Overdosage: If you think you have taken too much of this medicine contact a poison control center or emergency room at once. NOTE: This medicine is only for you. Do not share this medicine with others. What if I miss a dose? If you miss a dose, take it as soon as you can. If it is almost time for your next dose, take only that dose. Do not take double or extra doses. What may interact with this medicine? -cholestyramine -colestipol -digoxin -dofetilide -lithium -medicines for blood pressure -medicines for  diabetes -medicines that relax muscles for surgery -other diuretics -steroid medicines like prednisone or cortisone This list may not describe all possible interactions. Give your health care provider a list of all the medicines, herbs, non-prescription drugs, or dietary supplements you use. Also tell them if you smoke, drink alcohol, or use illegal drugs. Some items may interact with your medicine. What should I watch for while using this medicine? Visit your doctor or health care professional for regular checks on your progress. Check your blood pressure as directed. Ask your doctor or health  care professional what your blood pressure should be and when you should contact him or her. You may need to be on a special diet while taking this medicine. Ask your doctor. Check with your doctor or health care professional if you get an attack of severe diarrhea, nausea and vomiting, or if you sweat a lot. The loss of too much body fluid can make it dangerous for you to take this medicine. You may get drowsy or dizzy. Do not drive, use machinery, or do anything that needs mental alertness until you know how this medicine affects you. Do not stand or sit up quickly, especially if you are an older patient. This reduces the risk of dizzy or fainting spells. Alcohol may interfere with the effect of this medicine. Avoid alcoholic drinks. This medicine may affect your blood sugar level. If you have diabetes, check with your doctor or health care professional before changing the dose of your diabetic medicine. This medicine can make you more sensitive to the sun. Keep out of the sun. If you cannot avoid being in the sun, wear protective clothing and use sunscreen. Do not use sun lamps or tanning beds/booths. What side effects may I notice from receiving this medicine? Side effects that you should report to your doctor or health care professional as soon as possible: -allergic reactions such as skin rash or itching,  hives, swelling of the lips, mouth, tongue, or throat -changes in vision -chest pain -eye pain -fast or irregular heartbeat -feeling faint or lightheaded, falls -gout attack -muscle pain or cramps -pain or difficulty when passing urine -pain, tingling, numbness in the hands or feet -redness, blistering, peeling or loosening of the skin, including inside the mouth -unusually weak or tired Side effects that usually do not require medical attention (report to your doctor or health care professional if they continue or are bothersome): -change in sex drive or performance -dry mouth -headache -stomach upset This list may not describe all possible side effects. Call your doctor for medical advice about side effects. You may report side effects to FDA at 1-800-FDA-1088. Where should I keep my medicine? Keep out of the reach of children. Store at room temperature between 15 and 30 degrees C (59 and 86 degrees F). Do not freeze. Protect from light and moisture. Keep container closed tightly. Throw away any unused medicine after the expiration date. NOTE: This sheet is a summary. It may not cover all possible information. If you have questions about this medicine, talk to your doctor, pharmacist, or health care provider.  2018 Elsevier/Gold Standard (2010-08-02 12:57:37)

## 2017-12-30 NOTE — Progress Notes (Signed)
Patient: Kristine Strickland Female    DOB: [birth year]   62 y.o.   MRN: [id number] Visit Date: 12/30/2017  Today's Provider: Margaretann Loveless, PA-C   No chief complaint on file.  Subjective:    HPI Patient is coming in today to follow-up elevated blood pressure. Patient was seen with Cardiology on 12/24/2017 and was noted that her blood pressure was elevated at rest and with stress. The Stress echo shows normal heart function without evidence of a significant coronary artery blockage. Patient has been checking her blood pressure at home this are the following readings:Monday night 150/85, Tuesday AM 142/76, Tuesday pm 130/80 and Wednesday mid day 160/85. She denies chest pain, leg swelling, headache,light headedness,dizziness,visual disturbance or fatigue.    Allergies  Allergen Reactions  . Codeine Other (See Comments)    Not allergic just has N/V Not allergic just has N/V  . Penicillin G Other (See Comments)  . Penicillins Other (See Comments)    Has patient had a PCN reaction causing immediate rash, facial/tongue/throat swelling, SOB or lightheadedness with hypotension: No Has patient had a PCN reaction causing severe rash involving mucus membranes or skin necrosis: No Has patient had a PCN reaction that required hospitalization: No Has patient had a PCN reaction occurring within the last 10 years: No If all of the above answers are "NO", then may proceed with Cephalosporin use.  . Sulfa Antibiotics Other (See Comments)    As child.  Reports mother told her she was allergic .  She is unsure of reaction. As child.  Reports mother told her she was allergic .  She is unsure of reaction.     Current Outpatient Medications:  .  aspirin 81 MG tablet, Take 1 tablet by mouth daily., Disp: , Rfl:  .  calcium carbonate (TUMS - DOSED IN MG ELEMENTAL CALCIUM) 500 MG chewable tablet, Chew 1 tablet by mouth daily. , Disp: , Rfl:  .  Cholecalciferol (VITAMIN D) 2000 units CAPS, Take  by mouth daily. , Disp: , Rfl:  .  docusate sodium (COLACE) 100 MG capsule, Take 2 capsules by mouth daily., Disp: , Rfl:  .  levothyroxine (SYNTHROID, LEVOTHROID) 112 MCG tablet, TAKE (1) TABLET DAILY FOR THYROID., Disp: 30 tablet, Rfl: 11 .  Melatonin 5 MG TABS, Take 10 mg by mouth at bedtime. , Disp: , Rfl:  .  omeprazole (PRILOSEC) 20 MG capsule, Take by mouth., Disp: , Rfl:   Review of Systems  Constitutional: Negative for fatigue.  Eyes: Negative for visual disturbance.  Respiratory: Negative for cough, chest tightness, shortness of breath and wheezing.   Cardiovascular: Negative for chest pain, palpitations and leg swelling.  Neurological: Negative for dizziness, light-headedness and headaches.    Social History   Tobacco Use  . Smoking status: Never Smoker  . Smokeless tobacco: Never Used  Substance Use Topics  . Alcohol use: No    Alcohol/week: 0.0 oz   Objective:   BP 140/90 (BP Location: Left Arm, Patient Position: Sitting, Cuff Size: Normal)   Pulse 83   Temp 98.7 F (37.1 C) (Oral)   Resp 16   Ht 5\' 4"  (1.626 m)   Wt 166 lb (75.3 kg)   SpO2 99%   BMI 28.49 kg/m    Physical Exam  Constitutional: She appears well-developed and well-nourished. No distress.  Neck: Normal range of motion. Neck supple.  Cardiovascular: Normal rate, regular rhythm and normal heart sounds. Exam reveals no gallop and no friction  rub.  No murmur heard. Pulmonary/Chest: Effort normal and breath sounds normal. No respiratory distress. She has no wheezes. She has no rales.  Musculoskeletal: She exhibits no edema.  Skin: She is not diaphoretic.  Vitals reviewed.       Assessment & Plan:     1. Essential hypertension Elevated. Will start HCTZ as below. Patient is to call if adverse effects. I will see her back in 4 weeks for BP recheck and BMP.  - hydrochlorothiazide (HYDRODIURIL) 25 MG tablet; Take 1 tablet (25 mg total) by mouth daily.  Dispense: 30 tablet; Refill: 1        Margaretann LovelessJennifer M Doni Bacha, PA-C  [facility]

## 2017-12-31 DIAGNOSIS — D1800 Hemangioma unspecified site: Secondary | ICD-10-CM | POA: Diagnosis not present

## 2017-12-31 DIAGNOSIS — L821 Other seborrheic keratosis: Secondary | ICD-10-CM | POA: Diagnosis not present

## 2017-12-31 DIAGNOSIS — D485 Neoplasm of uncertain behavior of skin: Secondary | ICD-10-CM | POA: Diagnosis not present

## 2018-01-06 ENCOUNTER — Ambulatory Visit: Payer: BLUE CROSS/BLUE SHIELD | Admitting: Gastroenterology

## 2018-01-06 ENCOUNTER — Encounter: Payer: Self-pay | Admitting: Gastroenterology

## 2018-01-06 VITALS — BP 153/83 | HR 74 | Ht 64.0 in | Wt 166.0 lb

## 2018-01-06 DIAGNOSIS — R748 Abnormal levels of other serum enzymes: Secondary | ICD-10-CM | POA: Diagnosis not present

## 2018-01-06 DIAGNOSIS — R1013 Epigastric pain: Secondary | ICD-10-CM | POA: Diagnosis not present

## 2018-01-06 DIAGNOSIS — G8929 Other chronic pain: Secondary | ICD-10-CM | POA: Diagnosis not present

## 2018-01-06 MED ORDER — HYOSCYAMINE SULFATE 0.125 MG SL SUBL: 0.1250 mg | SUBLINGUAL_TABLET | SUBLINGUAL | 3 refills | Status: DC | PRN

## 2018-01-06 NOTE — Progress Notes (Signed)
Gastroenterology Consultation  Referring Provider:     Suella GroveBurnette, Jennifer M, P* Primary Care Physician:  Margaretann LovelessBurnette, Jennifer M, PA-C Primary Gastroenterologist:  Dr. Servando SnareWohl     Reason for Consultation:     Abnormal lipase        HPI:   Kristine LullBarbara J Strickland is a 62 y.o. y/o female referred for consultation & management of abnormal lipase by Dr. Rosezetta SchlatterBurnette, Alessandra BevelsJennifer M, PA-C.  This patient was sent to me for evaluation for abnormal lipase. The patient had a slightly elevated lipase at 52 back in the end of November 2018. The patient then had it rechecked 2 weeks later in the middle of December which showed the level to have come down to 26. At the first blood draw the patient's AST was also elevated at 105 but on the repeat the AST was normal at 17. All the other liver enzymes had remained normal. The patient was seen by cardiology for chest pain and abdominal pain and they recommended a GI workup. The patient's last colonoscopy was back in 2009. The patient denies any alcohol use or abuse. The patient also reports that she has abdominal pain that is in the center of her abdomen and is not associated with eating or drinking.  Past Medical History:  Diagnosis Date  . Thyroid disease    hypothyroidism    Past Surgical History:  Procedure Laterality Date  . BLADDER STEM    . CHOLECYSTECTOMY    . COLONOSCOPY    . DILATION AND CURETTAGE OF UTERUS      Prior to Admission medications   Medication Sig Start Date End Date Taking? Authorizing Provider  aspirin 81 MG tablet Take 1 tablet by mouth daily.    [provider]  calcium carbonate (TUMS - DOSED IN MG ELEMENTAL CALCIUM) 500 MG chewable tablet Chew 1 tablet by mouth daily.     [provider]  Cholecalciferol (VITAMIN D) 2000 units CAPS Take by mouth daily.     [provider]  docusate sodium (COLACE) 100 MG capsule Take 2 capsules by mouth daily.    [provider]  hydrochlorothiazide (HYDRODIURIL) 25 MG  tablet Take 1 tablet (25 mg total) by mouth daily. 12/30/17   Margaretann LovelessBurnette, Jennifer M, PA-C  levothyroxine (SYNTHROID, LEVOTHROID) 112 MCG tablet TAKE (1) TABLET DAILY FOR THYROID. 09/01/17   Margaretann LovelessBurnette, Jennifer M, PA-C  Melatonin 5 MG TABS Take 10 mg by mouth at bedtime.     [provider]  omeprazole (PRILOSEC) 20 MG capsule Take by mouth.    [provider]    Family History  Problem Relation Age of Onset  . Hypertension Mother   . Hyperlipidemia Mother   . Epilepsy Mother   . Pancreatic cancer Maternal Grandmother   . Hypertension Maternal Grandmother   . Heart failure Maternal Grandfather   . Heart attack Paternal Grandfather   . Hypertension Paternal Grandfather   . Hypothyroidism Sister   . Diverticulitis Sister   . Alcohol abuse Brother   . Breast cancer Maternal Aunt [id number]     Social History   Tobacco Use  . Smoking status: Never Smoker  . Smokeless tobacco: Never Used  Substance Use Topics  . Alcohol use: No    Alcohol/week: 0.0 oz  . Drug use: No    Allergies as of 01/06/2018 - Review Complete 01/06/2018  Allergen Reaction Noted  . Codeine Other (See Comments) 07/23/2015  . Penicillin g Other (See Comments) 06/02/2015  . Penicillins Other (  See Comments) 07/23/2015  . Sulfa antibiotics Other (See Comments) 07/23/2015    Review of Systems:    All systems reviewed and negative except where noted in HPI.   Physical Exam:  BP (!) 153/83   Pulse 74   Ht 5\' 4"  (1.626 m)   Wt 166 lb (75.3 kg)   BMI 28.49 kg/m  No LMP recorded. Patient is postmenopausal. Psych:  Alert and cooperative. Normal mood and affect. General:   Alert,  Well-developed, well-nourished, pleasant and cooperative in NAD Head:  Normocephalic and atraumatic. Eyes:  Sclera clear, no icterus.   Conjunctiva pink. Ears:  Normal auditory acuity. Nose:  No deformity, discharge, or lesions. Mouth:  No deformity or lesions,oropharynx pink & moist. Neck:  Supple; no masses or  thyromegaly. Lungs:  Respirations even and unlabored.  Clear throughout to auscultation.   No wheezes, crackles, or rhonchi. No acute distress. Heart:  Regular rate and rhythm; no murmurs, clicks, rubs, or gallops. Abdomen:  Normal bowel sounds.  No bruits.  Soft, non-tender and non-distended without masses, hepatosplenomegaly or hernias noted.  No guarding or rebound tenderness.  Negative Carnett sign.   Rectal:  Deferred.  Msk:  Symmetrical without gross deformities.  Good, equal movement & strength bilaterally. Pulses:  Normal pulses noted. Extremities:  No clubbing or edema.  No cyanosis. Neurologic:  Alert and oriented x3;  grossly normal neurologically. Skin:  Intact without significant lesions or rashes.  No jaundice. Lymph Nodes:  No significant cervical adenopathy. Psych:  Alert and cooperative. Normal mood and affect.  Imaging Studies: No results found.  Assessment and Plan:   Kristine Strickland is a 62 y.o. y/o female who comes in with a recent episode of elevated lipase and AST which have come back to normal. The patient denies alcohol abuse or drug abuse. Patient's labs have come back to normal. The patient will be started on Levsin sublingually for her intermittent abdominal pains. The patient has been explained the plan and agrees with it.  [name], MD. Clementeen Graham   Note: This dictation was prepared with Dragon dictation along with smaller phrase technology. Any transcriptional errors that result from this process are unintentional.

## 2018-01-11 ENCOUNTER — Ambulatory Visit
Admission: RE | Admit: 2018-01-11 | Discharge: 2018-01-11 | Disposition: A | Discharge status: Home / Self Care | Payer: BLUE CROSS/BLUE SHIELD | Source: Ambulatory Visit | Attending: Physician Assistant | Admitting: Physician Assistant

## 2018-01-11 ENCOUNTER — Telehealth: Payer: Self-pay

## 2018-01-11 ENCOUNTER — Encounter: Payer: Self-pay | Admitting: Physician Assistant

## 2018-01-11 ENCOUNTER — Ambulatory Visit: Payer: BLUE CROSS/BLUE SHIELD | Admitting: Physician Assistant

## 2018-01-11 VITALS — BP 128/78 | HR 88 | Temp 98.5°F | Resp 16 | Wt 165.0 lb

## 2018-01-11 DIAGNOSIS — R509 Fever, unspecified: Secondary | ICD-10-CM | POA: Insufficient documentation

## 2018-01-11 DIAGNOSIS — R69 Illness, unspecified: Principal | ICD-10-CM

## 2018-01-11 DIAGNOSIS — J111 Influenza due to unidentified influenza virus with other respiratory manifestations: Secondary | ICD-10-CM

## 2018-01-11 LAB — POCT INFLUENZA A/B
Influenza A, POC: NEGATIVE
Influenza B, POC: NEGATIVE

## 2018-01-11 MED ORDER — OSELTAMIVIR PHOSPHATE 75 MG PO CAPS: 75.0000 mg | ORAL_CAPSULE | Freq: Two times a day (BID) | ORAL | 0 refills | Status: AC

## 2018-01-11 NOTE — Telephone Encounter (Signed)
Pt advised.  She would like to start Tamiflu.  [facility] send to [facility].  Thanks,   -Vernona RiegerLaura

## 2018-01-11 NOTE — Telephone Encounter (Signed)
LMTCB 01/11/2018  Thanks,   -Laura  

## 2018-01-11 NOTE — Patient Instructions (Signed)

## 2018-01-11 NOTE — Progress Notes (Signed)
Patient: Kristine Strickland Female    DOB: 1956/03/01   62 y.o.   MRN: [id number] Visit Date: 01/11/2018  Today's Provider: Trey Sailors, PA-C   Chief Complaint  Patient presents with  . Fever    Since 01/10/2018.  Highest Temp 103    Subjective:    Kristine Strickland is a 62 y/o woman presenting with below symptoms.  Fever   This is a new problem. The current episode started yesterday. The problem has been unchanged. The maximum temperature noted was 103 to 103.9 F. Associated symptoms include headaches, muscle aches and sleepiness. Pertinent negatives include no abdominal pain, chest pain, congestion, coughing, diarrhea, ear pain, nausea, rash, sore throat or urinary pain. She has tried acetaminophen for the symptoms. The treatment provided significant relief.       Allergies  Allergen Reactions  . Codeine Other (See Comments)    Not allergic just has N/V Not allergic just has N/V  . Penicillin G Other (See Comments)  . Penicillins Other (See Comments)    Has patient had a PCN reaction causing immediate rash, facial/tongue/throat swelling, SOB or lightheadedness with hypotension: No Has patient had a PCN reaction causing severe rash involving mucus membranes or skin necrosis: No Has patient had a PCN reaction that required hospitalization: No Has patient had a PCN reaction occurring within the last 10 years: No If all of the above answers are "NO", then may proceed with Cephalosporin use.  . Sulfa Antibiotics Other (See Comments)    As child.  Reports mother told her she was allergic .  She is unsure of reaction. As child.  Reports mother told her she was allergic .  She is unsure of reaction.     Current Outpatient Medications:  .  aspirin 81 MG tablet, Take 1 tablet by mouth daily., Disp: , Rfl:  .  calcium carbonate (TUMS - DOSED IN MG ELEMENTAL CALCIUM) 500 MG chewable tablet, Chew 1 tablet by mouth daily. , Disp: , Rfl:  .  Cholecalciferol (VITAMIN D) 2000  units CAPS, Take by mouth daily. , Disp: , Rfl:  .  dexlansoprazole (DEXILANT) 60 MG capsule, Take 60 mg by mouth daily., Disp: , Rfl:  .  docusate sodium (COLACE) 100 MG capsule, Take 2 capsules by mouth daily., Disp: , Rfl:  .  hyoscyamine (LEVSIN SL) 0.125 MG SL tablet, Place 1 tablet (0.125 mg total) under the tongue every 4 (four) hours as needed., Disp: 30 tablet, Rfl: 3 .  levothyroxine (SYNTHROID, LEVOTHROID) 112 MCG tablet, TAKE (1) TABLET DAILY FOR THYROID., Disp: 30 tablet, Rfl: 11 .  Melatonin 5 MG TABS, Take 10 mg by mouth at bedtime. , Disp: , Rfl:  .  hydrochlorothiazide (HYDRODIURIL) 25 MG tablet, Take 1 tablet (25 mg total) by mouth daily. (Patient not taking: Reported on 01/11/2018), Disp: 30 tablet, Rfl: 1 .  omeprazole (PRILOSEC) 20 MG capsule, Take by mouth., Disp: , Rfl:   Review of Systems  Constitutional: Positive for chills, diaphoresis, fatigue and fever. Negative for activity change, appetite change and unexpected weight change.  HENT: Negative for congestion, ear discharge, ear pain, nosebleeds, postnasal drip, rhinorrhea, sinus pressure, sinus pain, sneezing, sore throat, tinnitus, trouble swallowing and voice change.   Eyes: Negative.   Respiratory: Negative.  Negative for cough.   Cardiovascular: Negative for chest pain.  Gastrointestinal: Negative.  Negative for abdominal pain, diarrhea and nausea.  Genitourinary: Negative for dysuria.  Musculoskeletal: Positive for arthralgias, back pain  and myalgias.  Skin: Negative for rash.  Neurological: Positive for headaches. Negative for dizziness and light-headedness.    Social History   Tobacco Use  . Smoking status: Never Smoker  . Smokeless tobacco: Never Used  Substance Use Topics  . Alcohol use: No    Alcohol/week: 0.0 oz   Objective:   BP 128/78 (BP Location: Right Arm, Patient Position: Sitting, Cuff Size: Normal)   Pulse 88   Temp 98.5 F (36.9 C) (Oral)   Resp 16   Wt 165 lb (74.8 kg)   BMI 28.32  kg/m  Vitals:   01/11/18 1323  BP: 128/78  Pulse: 88  Resp: 16  Temp: 98.5 F (36.9 C)  TempSrc: Oral  Weight: 165 lb (74.8 kg)     Physical Exam  Constitutional: She is oriented to person, place, and time. She appears well-developed and well-nourished.  HENT:  Right Ear: External ear normal.  Left Ear: External ear normal.  Mouth/Throat: Oropharynx is clear and moist. No oropharyngeal exudate.  Eyes: Conjunctivae are normal.  Neck: Neck supple.  Cardiovascular: Normal rate and regular rhythm.  Pulmonary/Chest: Effort normal and breath sounds normal. She has no rales.  Lymphadenopathy:    She has no cervical adenopathy.  Neurological: She is alert and oriented to person, place, and time.  Skin: Skin is warm and dry.  Psychiatric: She has a normal mood and affect. Her behavior is normal.        Assessment & Plan:     1. Fever, unspecified fever cause  CXR is clear, no evidence of pneumonia. Rapid flu is negative. In absence of other symptoms, will treat as flu like illness with Tamiflu, which has been sent in.   - POCT Influenza A/B - DG Chest 2 View; Future  Return if symptoms worsen or fail to improve.  The entirety of the information documented in the History of Present Illness, Review of Systems and Physical Exam were personally obtained by me. Portions of this information were initially documented by Kavin LeechLaura Walsh, CMA and reviewed by me for thoroughness and accuracy.         Trey SailorsAdriana M Jayme Mednick, PA-C  [facility]

## 2018-01-11 NOTE — Telephone Encounter (Signed)
Tamiflu sent.

## 2018-01-11 NOTE — Telephone Encounter (Signed)
-----   Message from Trey SailorsAdriana M Pollak, [place]PA-C sent at 01/11/2018  2:30 PM EST ----- Her CXR is negative. Could possibly be a false negative for flu, though we did discuss Tamiflu in office. If patient would like it, I can send it in. If not, she may continue to monitor for improvement or other symptoms.

## 2018-01-12 ENCOUNTER — Telehealth: Payer: Self-pay | Admitting: Gastroenterology

## 2018-01-12 NOTE — Telephone Encounter (Signed)
Patient left a voice message that she needs a rx of Dexilant. She was given samples and only has 3 left. Please call

## 2018-01-13 ENCOUNTER — Other Ambulatory Visit: Payer: Self-pay

## 2018-01-13 MED ORDER — DEXLANSOPRAZOLE 60 MG PO CPDR: 60.0000 mg | DELAYED_RELEASE_CAPSULE | Freq: Every day | ORAL | 6 refills | Status: DC

## 2018-01-13 NOTE — Telephone Encounter (Signed)
Pt notified rx for Dexilant has been sent to her pharmacy.

## 2018-01-13 NOTE — Telephone Encounter (Signed)
Patient left a voice message for you to call her. Please call °

## 2018-01-25 ENCOUNTER — Ambulatory Visit: Payer: BLUE CROSS/BLUE SHIELD | Admitting: Physician Assistant

## 2018-01-25 ENCOUNTER — Encounter: Payer: Self-pay | Admitting: Physician Assistant

## 2018-01-25 ENCOUNTER — Ambulatory Visit
Admission: RE | Admit: 2018-01-25 | Discharge: 2018-01-25 | Disposition: A | Discharge status: Home / Self Care | Payer: BLUE CROSS/BLUE SHIELD | Source: Ambulatory Visit | Attending: Physician Assistant | Admitting: Physician Assistant

## 2018-01-25 ENCOUNTER — Telehealth: Payer: Self-pay

## 2018-01-25 VITALS — BP 132/82 | HR 68 | Temp 98.0°F | Resp 16 | Wt 169.0 lb

## 2018-01-25 DIAGNOSIS — Z1231 Encounter for screening mammogram for malignant neoplasm of breast: Secondary | ICD-10-CM | POA: Diagnosis not present

## 2018-01-25 DIAGNOSIS — K224 Dyskinesia of esophagus: Secondary | ICD-10-CM | POA: Diagnosis not present

## 2018-01-25 DIAGNOSIS — I10 Essential (primary) hypertension: Secondary | ICD-10-CM | POA: Diagnosis not present

## 2018-01-25 DIAGNOSIS — Z1239 Encounter for other screening for malignant neoplasm of breast: Secondary | ICD-10-CM

## 2018-01-25 NOTE — Telephone Encounter (Signed)
Advised patient as below.  

## 2018-01-25 NOTE — Telephone Encounter (Signed)
-----   Message from Margaretann LovelessJennifer M Burnette, [place]PA-C sent at 01/25/2018  1:07 PM EST ----- Normal mammogram. Repeat screening in one year.

## 2018-01-25 NOTE — Progress Notes (Signed)
Patient: Kristine Strickland Female    DOB: October 25, 1956   62 y.o.   MRN: [id number] Visit Date: 01/25/2018  Today's Provider: Margaretann LovelessJennifer M Coty Larsh, PA-C   Chief Complaint  Patient presents with  . Hypertension   Subjective:    HPI  Hypertension, follow-up:  BP Readings from Last 3 Encounters:  01/25/18 132/82  01/11/18 128/78  01/06/18 (!) 153/83    She was last seen for hypertension 4 weeks ago.  BP at that visit was 140/90. Management changes since that visit include start HCTZ. Patient reports that on 01/06/18 she was advised by Dr. Servando SnareWohl to D/C HCTZ and Omeprazole. Patient was started on Dexilant and Levsin. She was diagnosed with esophageal spasm. She reports excellent compliance with treatment. She is not having side effects.  She is not exercising. She is adherent to low salt diet.   Outside blood pressures are stable. She is experiencing none.  Patient denies chest pain and lower extremity edema.   Cardiovascular risk factors include hypertension.  Use of agents associated with hypertension: none.     Weight trend: stable Wt Readings from Last 3 Encounters:  01/25/18 169 lb (76.7 kg)  01/11/18 165 lb (74.8 kg)  01/06/18 166 lb (75.3 kg)    Current diet: in general, a "healthy" diet    ------------------------------------------------------------------------      Allergies  Allergen Reactions  . Codeine Other (See Comments)    Not allergic just has N/V Not allergic just has N/V  . Penicillin G Other (See Comments)  . Penicillins Other (See Comments)    Has patient had a PCN reaction causing immediate rash, facial/tongue/throat swelling, SOB or lightheadedness with hypotension: No Has patient had a PCN reaction causing severe rash involving mucus membranes or skin necrosis: No Has patient had a PCN reaction that required hospitalization: No Has patient had a PCN reaction occurring within the last 10 years: No If all of the above answers are "NO",  then may proceed with Cephalosporin use.  . Sulfa Antibiotics Other (See Comments)    As child.  Reports mother told her she was allergic .  She is unsure of reaction. As child.  Reports mother told her she was allergic .  She is unsure of reaction.     Current Outpatient Medications:  .  aspirin 81 MG tablet, Take 1 tablet by mouth daily., Disp: , Rfl:  .  calcium carbonate (TUMS - DOSED IN MG ELEMENTAL CALCIUM) 500 MG chewable tablet, Chew 1 tablet by mouth daily. , Disp: , Rfl:  .  Cholecalciferol (VITAMIN D) 2000 units CAPS, Take by mouth daily. , Disp: , Rfl:  .  dexlansoprazole (DEXILANT) 60 MG capsule, Take 1 capsule (60 mg total) by mouth daily., Disp: 30 capsule, Rfl: 6 .  docusate sodium (COLACE) 100 MG capsule, Take 2 capsules by mouth daily., Disp: , Rfl:  .  hyoscyamine (LEVSIN SL) 0.125 MG SL tablet, Place 1 tablet (0.125 mg total) under the tongue every 4 (four) hours as needed., Disp: 30 tablet, Rfl: 3 .  levothyroxine (SYNTHROID, LEVOTHROID) 112 MCG tablet, TAKE (1) TABLET DAILY FOR THYROID., Disp: 30 tablet, Rfl: 11 .  Melatonin 5 MG TABS, Take 10 mg by mouth at bedtime. , Disp: , Rfl:  .  hydrochlorothiazide (HYDRODIURIL) 25 MG tablet, Take 1 tablet (25 mg total) by mouth daily. (Patient not taking: Reported on 01/25/2018), Disp: 30 tablet, Rfl: 1 .  omeprazole (PRILOSEC) 20 MG capsule, Take by mouth., Disp: ,  Rfl:   Review of Systems  Constitutional: Negative.   HENT: Negative.   Respiratory: Negative.   Cardiovascular: Negative.   Gastrointestinal: Negative.   Neurological: Negative.     Social History   Tobacco Use  . Smoking status: Never Smoker  . Smokeless tobacco: Never Used  Substance Use Topics  . Alcohol use: No    Alcohol/week: 0.0 oz   Objective:   BP 132/82 (BP Location: Left Arm, Patient Position: Sitting, Cuff Size: Normal)   Pulse 68   Temp 98 F (36.7 C) (Oral)   Resp 16   Wt 169 lb (76.7 kg)   SpO2 99%   BMI 29.01 kg/m  Vitals:    01/25/18 0850  BP: 132/82  Pulse: 68  Resp: 16  Temp: 98 F (36.7 C)  TempSrc: Oral  SpO2: 99%  Weight: 169 lb (76.7 kg)     Physical Exam  Constitutional: She appears well-developed and well-nourished. No distress.  Neck: Normal range of motion. Neck supple. No tracheal deviation present. No thyromegaly present.  Cardiovascular: Normal rate, regular rhythm and normal heart sounds. Exam reveals no gallop and no friction rub.  No murmur heard. Pulmonary/Chest: Effort normal and breath sounds normal. No respiratory distress. She has no wheezes. She has no rales.  Musculoskeletal: She exhibits no edema.  Lymphadenopathy:    She has no cervical adenopathy.  Skin: She is not diaphoretic.  Vitals reviewed.       Assessment & Plan:     1. Essential hypertension Stable. Doing well without HCTZ. Will continue to monitor.   2. Esophageal spasm Followed by GI, Dr. Servando Snare. Continue Dexilant and Levsin (prn) as directed by Dr. Servando Snare.        Margaretann Loveless, PA-C  [facility]

## 2018-01-25 NOTE — Telephone Encounter (Signed)
Left message to call back  

## 2018-02-20 IMAGING — MG MM DIGITAL SCREENING BILAT W/ CAD
4 series · 4 of 4 positions shown · non-contrast
Comparison: Previous exam(s).

CLINICAL DATA: Screening.

EXAM:
DIGITAL SCREENING BILATERAL MAMMOGRAM WITH CAD

[R MLO]
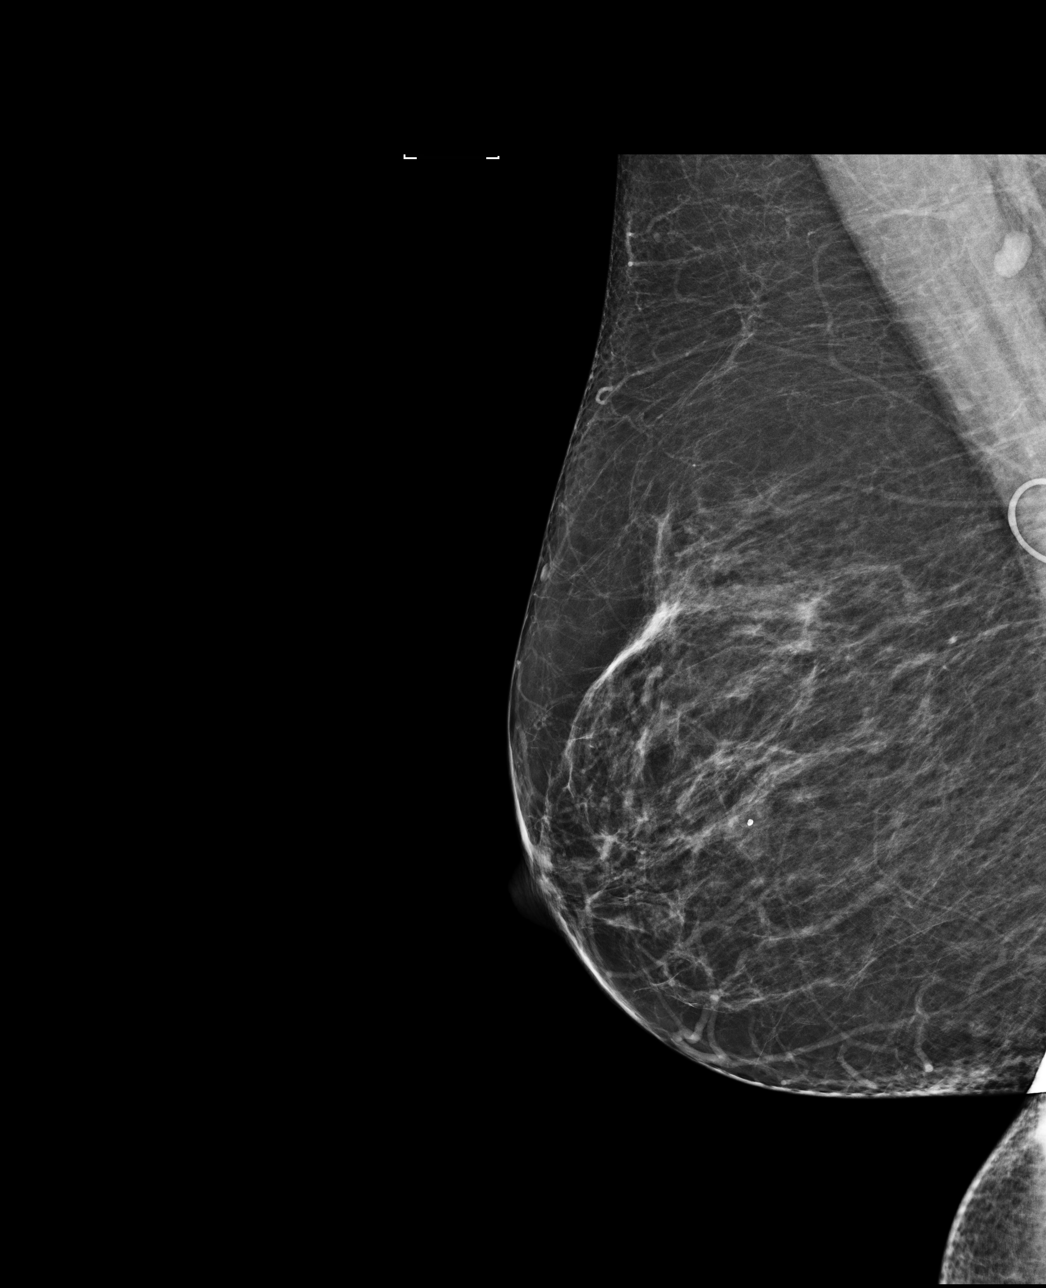

[R CC]
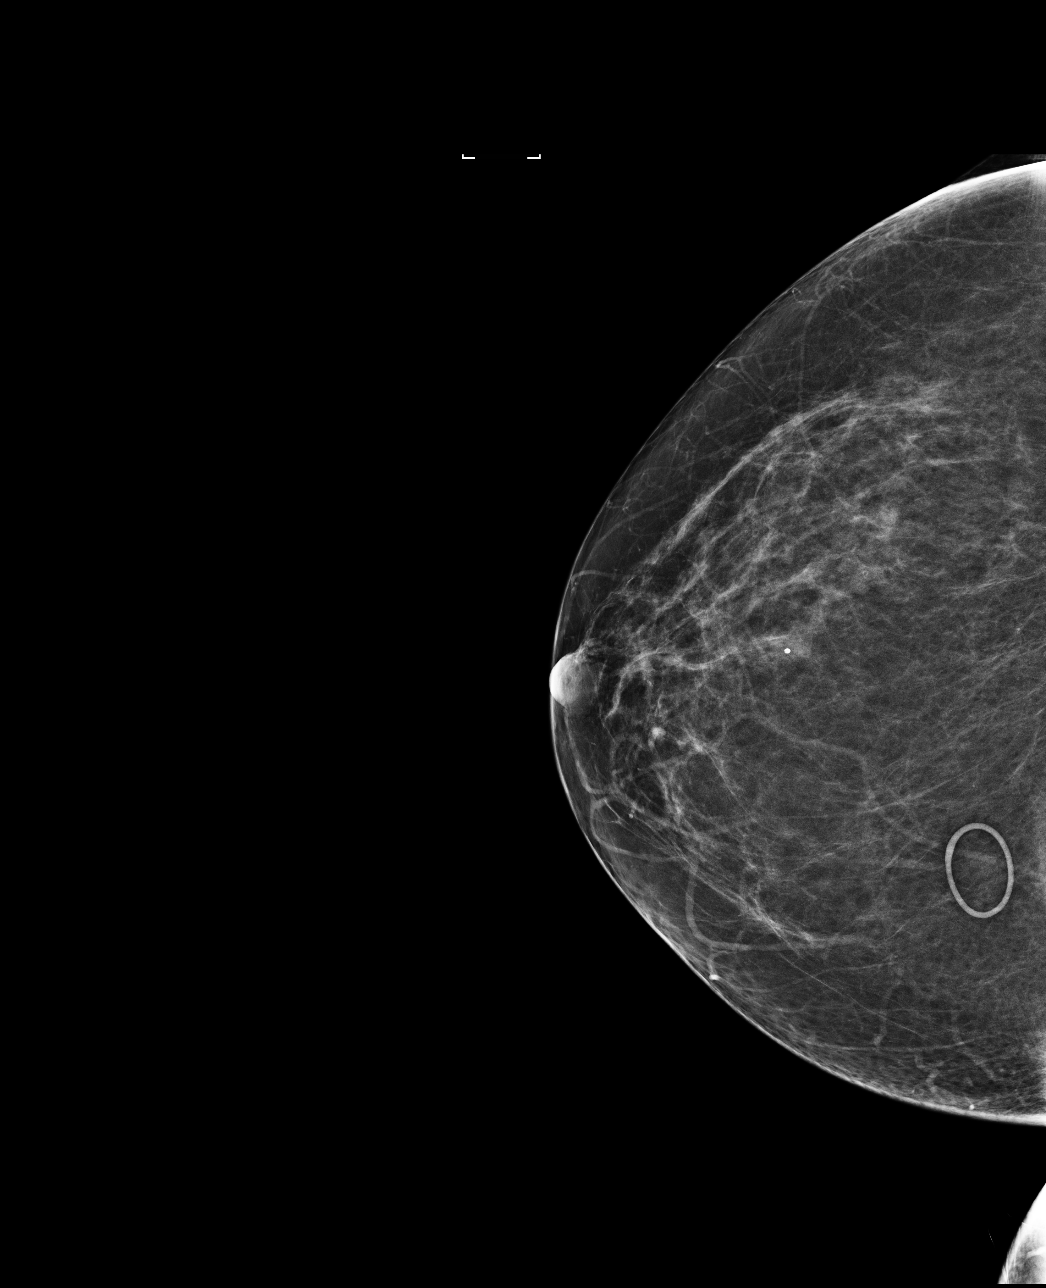

[L CC]
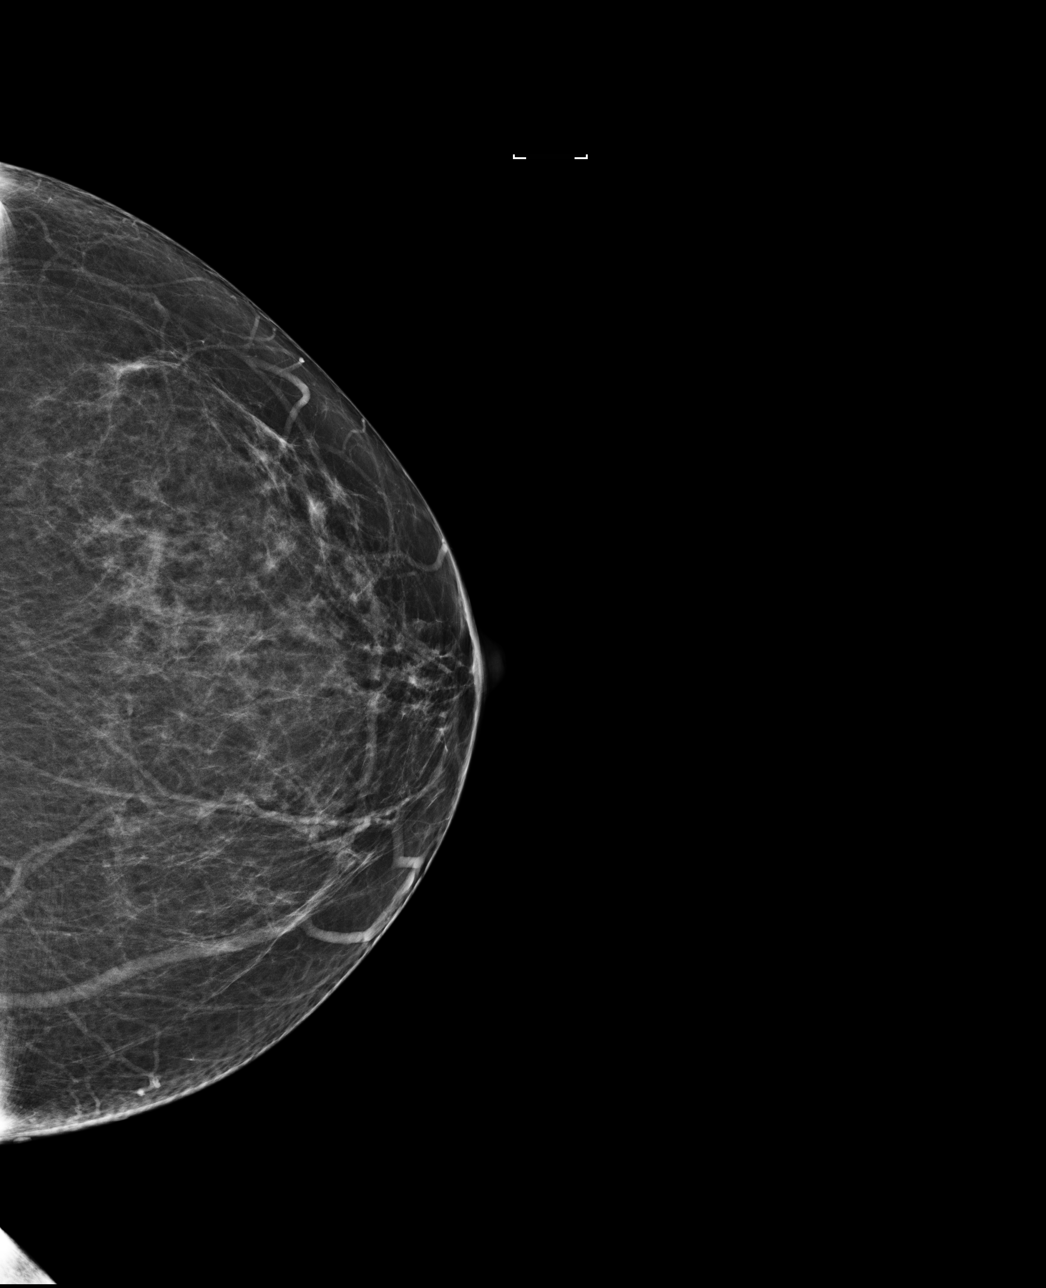

[L MLO]
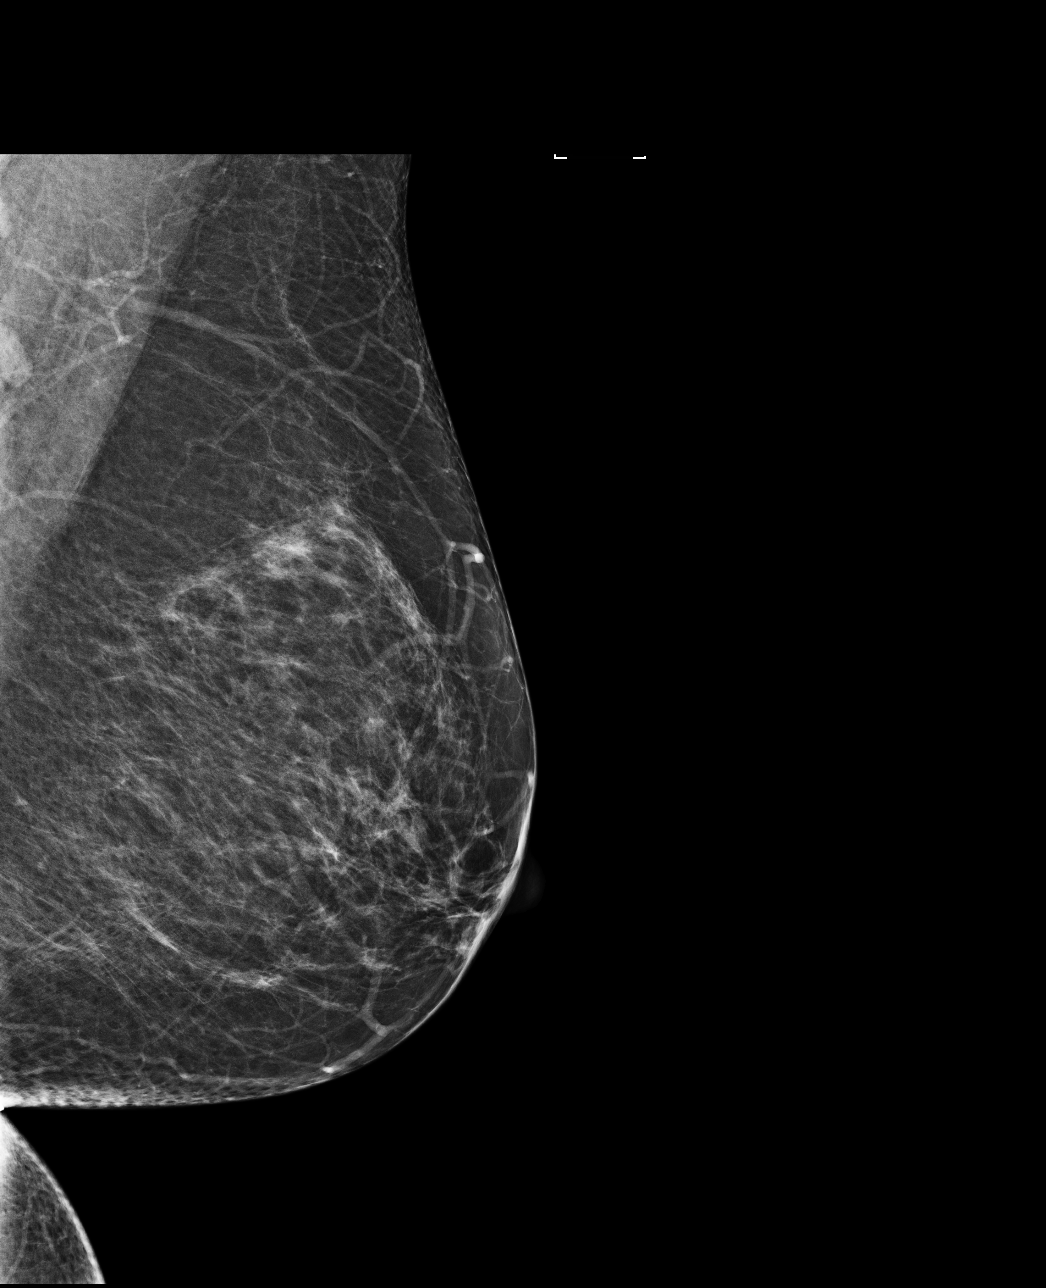

[4 of 4 positions shown; findings below may reference images not displayed]

ACR Breast Density Category b: There are scattered areas of
fibroglandular density.
FINDINGS: There are no findings suspicious for malignancy. Images were
processed with CAD.
IMPRESSION: No mammographic evidence of malignancy. A result letter of this
screening mammogram will be mailed directly to the patient.

RECOMMENDATION:
Screening mammogram in one year. (Code:AS-G-LCT)

BI-RADS CATEGORY  1: Negative.

## 2018-03-10 ENCOUNTER — Other Ambulatory Visit: Payer: Self-pay

## 2018-03-10 ENCOUNTER — Telehealth: Payer: Self-pay | Admitting: Gastroenterology

## 2018-03-10 MED ORDER — DEXLANSOPRAZOLE 60 MG PO CPDR: 60.0000 mg | DELAYED_RELEASE_CAPSULE | Freq: Every day | ORAL | 3 refills | Status: DC

## 2018-03-10 NOTE — Telephone Encounter (Signed)
Patient has to use [facility] on [address] Please call inb Dixilant 60mg . She will need a 90 day supply

## 2018-03-10 NOTE — Telephone Encounter (Signed)
Rx for Dexilant 60mg  has been sent to pharmacy.

## 2018-06-08 ENCOUNTER — Telehealth: Payer: Self-pay | Admitting: Gastroenterology

## 2018-06-08 NOTE — Telephone Encounter (Signed)
Pt would like to schedule 10 year colonoscopy if she is due. Please call pt

## 2018-06-11 ENCOUNTER — Other Ambulatory Visit: Payer: Self-pay

## 2018-06-11 DIAGNOSIS — Z1211 Encounter for screening for malignant neoplasm of colon: Secondary | ICD-10-CM

## 2018-06-11 NOTE — Telephone Encounter (Signed)
Contacted pt and scheduled colonoscopy. [facility]

## 2018-07-08 ENCOUNTER — Other Ambulatory Visit: Payer: Self-pay

## 2018-07-08 ENCOUNTER — Encounter: Payer: Self-pay | Admitting: *Deleted

## 2018-07-15 NOTE — Discharge Instructions (Signed)
General Anesthesia, Adult, Care After °These instructions provide you with information about caring for yourself after your procedure. Your health care provider may also give you more specific instructions. Your treatment has been planned according to current medical practices, but problems sometimes occur. Call your health care provider if you have any problems or questions after your procedure. °What can I expect after the procedure? °After the procedure, it is common to have: °· Vomiting. °· A sore throat. °· Mental slowness. ° °It is common to feel: °· Nauseous. °· Cold or shivery. °· Sleepy. °· Tired. °· Sore or achy, even in parts of your body where you did not have surgery. ° °Follow these instructions at home: °For at least 24 hours after the procedure: °· Do not: °? Participate in activities where you could fall or become injured. °? Drive. °? Use heavy machinery. °? Drink alcohol. °? Take sleeping pills or medicines that cause drowsiness. °? Make important decisions or sign legal documents. °? Take care of children on your own. °· Rest. °Eating and drinking °· If you vomit, drink water, juice, or soup when you can drink without vomiting. °· Drink enough fluid to keep your urine clear or pale yellow. °· Make sure you have little or no nausea before eating solid foods. °· Follow the diet recommended by your health care provider. °General instructions °· Have a responsible adult stay with you until you are awake and alert. °· Return to your normal activities as told by your health care provider. Ask your health care provider what activities are safe for you. °· Take over-the-counter and prescription medicines only as told by your health care provider. °· If you smoke, do not smoke without supervision. °· Keep all follow-up visits as told by your health care provider. This is important. °Contact a health care provider if: °· You continue to have nausea or vomiting at home, and medicines are not helpful. °· You  cannot drink fluids or start eating again. °· You cannot urinate after 8-12 hours. °· You develop a skin rash. °· You have fever. °· You have increasing redness at the site of your procedure. °Get help right away if: °· You have difficulty breathing. °· You have chest pain. °· You have unexpected bleeding. °· You feel that you are having a life-threatening or urgent problem. °This information is not intended to replace advice given to you by your health care provider. Make sure you discuss any questions you have with your health care provider. °Document Released: 03/16/2001 Document Revised: 05/12/2016 Document Reviewed: 11/22/2015 °Elsevier Interactive Patient Education © 2018 Elsevier Inc. ° °

## 2018-07-16 ENCOUNTER — Ambulatory Visit: Payer: BLUE CROSS/BLUE SHIELD | Admitting: Anesthesiology

## 2018-07-16 ENCOUNTER — Ambulatory Visit
Admission: RE | Admit: 2018-07-16 | Discharge: 2018-07-16 | Disposition: A | Discharge status: Home / Self Care | Payer: BLUE CROSS/BLUE SHIELD | Source: Ambulatory Visit | Attending: Gastroenterology | Admitting: Gastroenterology

## 2018-07-16 ENCOUNTER — Encounter
Admission: RE | Disposition: A | Discharge status: Home / Self Care | Payer: Self-pay | Source: Ambulatory Visit | Attending: Gastroenterology

## 2018-07-16 DIAGNOSIS — Z79899 Other long term (current) drug therapy: Secondary | ICD-10-CM | POA: Insufficient documentation

## 2018-07-16 DIAGNOSIS — Z7982 Long term (current) use of aspirin: Secondary | ICD-10-CM | POA: Insufficient documentation

## 2018-07-16 DIAGNOSIS — Z882 Allergy status to sulfonamides status: Secondary | ICD-10-CM | POA: Insufficient documentation

## 2018-07-16 DIAGNOSIS — Z880 Allergy status to penicillin: Secondary | ICD-10-CM | POA: Diagnosis not present

## 2018-07-16 DIAGNOSIS — Z1211 Encounter for screening for malignant neoplasm of colon: Secondary | ICD-10-CM | POA: Diagnosis not present

## 2018-07-16 DIAGNOSIS — E039 Hypothyroidism, unspecified: Secondary | ICD-10-CM | POA: Insufficient documentation

## 2018-07-16 DIAGNOSIS — K219 Gastro-esophageal reflux disease without esophagitis: Secondary | ICD-10-CM | POA: Diagnosis not present

## 2018-07-16 DIAGNOSIS — Z8249 Family history of ischemic heart disease and other diseases of the circulatory system: Secondary | ICD-10-CM | POA: Diagnosis not present

## 2018-07-16 DIAGNOSIS — K640 First degree hemorrhoids: Secondary | ICD-10-CM | POA: Insufficient documentation

## 2018-07-16 DIAGNOSIS — Z885 Allergy status to narcotic agent status: Secondary | ICD-10-CM | POA: Diagnosis not present

## 2018-07-16 DIAGNOSIS — M1611 Unilateral primary osteoarthritis, right hip: Secondary | ICD-10-CM | POA: Insufficient documentation

## 2018-07-16 HISTORY — DX: Gastro-esophageal reflux disease without esophagitis: K21.9

## 2018-07-16 HISTORY — PX: COLONOSCOPY WITH PROPOFOL: SHX5780

## 2018-07-16 HISTORY — DX: Unspecified osteoarthritis, unspecified site: M19.90

## 2018-07-16 HISTORY — DX: Hypothyroidism, unspecified: E03.9

## 2018-07-16 HISTORY — DX: Family history of other specified conditions: Z84.89

## 2018-07-16 SURGERY — COLONOSCOPY WITH PROPOFOL
Anesthesia: General | Site: Rectum | Wound class: Dirty or Infected

## 2018-07-16 MED ORDER — PROPOFOL 10 MG/ML IV BOLUS
INTRAVENOUS | Status: DC | PRN
  Administered 2018-07-16 (×6): 20 mg via INTRAVENOUS
  Administered 2018-07-16: 100 mg via INTRAVENOUS

## 2018-07-16 MED ORDER — FENTANYL CITRATE (PF) 100 MCG/2ML IJ SOLN: 25.0000 ug | INTRAMUSCULAR | Status: DC | PRN

## 2018-07-16 MED ORDER — SIMETHICONE 40 MG/0.6ML PO SUSP
ORAL | Status: DC | PRN
  Administered 2018-07-16: 10:00:00

## 2018-07-16 MED ORDER — SODIUM CHLORIDE 0.9 % IV SOLN: INTRAVENOUS | Status: DC

## 2018-07-16 MED ORDER — LACTATED RINGERS IV SOLN
INTRAVENOUS | Status: DC
  Administered 2018-07-16: 10:00:00 via INTRAVENOUS

## 2018-07-16 MED ORDER — LIDOCAINE HCL (CARDIAC) PF 100 MG/5ML IV SOSY
PREFILLED_SYRINGE | INTRAVENOUS | Status: DC | PRN
  Administered 2018-07-16: 30 mg via INTRAVENOUS

## 2018-07-16 MED ORDER — PROMETHAZINE HCL 25 MG/ML IJ SOLN: 6.2500 mg | INTRAMUSCULAR | Status: DC | PRN

## 2018-07-16 SURGICAL SUPPLY — 24 items
CANISTER SUCT 1200ML W/VALVE (MISCELLANEOUS) ×2 IMPLANT
CLIP HMST 235XBRD CATH ROT (MISCELLANEOUS) IMPLANT
CLIP RESOLUTION 360 11X235 (MISCELLANEOUS)
ELECT REM PT RETURN 9FT ADLT (ELECTROSURGICAL)
ELECTRODE REM PT RTRN 9FT ADLT (ELECTROSURGICAL) IMPLANT
FCP ESCP3.2XJMB 240X2.8X (MISCELLANEOUS)
FORCEPS BIOP RAD 4 LRG CAP 4 (CUTTING FORCEPS) IMPLANT
FORCEPS BIOP RJ4 240 W/NDL (MISCELLANEOUS)
FORCEPS ESCP3.2XJMB 240X2.8X (MISCELLANEOUS) IMPLANT
GOWN CVR UNV OPN BCK APRN NK (MISCELLANEOUS) ×2 IMPLANT
GOWN ISOL THUMB LOOP REG UNIV (MISCELLANEOUS) ×2
INJECTOR VARIJECT VIN23 (MISCELLANEOUS) IMPLANT
KIT DEFENDO VALVE AND CONN (KITS) IMPLANT
KIT ENDO PROCEDURE OLY (KITS) ×2 IMPLANT
MARKER SPOT ENDO TATTOO 5ML (MISCELLANEOUS) IMPLANT
PROBE APC STR FIRE (PROBE) IMPLANT
RETRIEVER NET ROTH 2.5X230 LF (MISCELLANEOUS) IMPLANT
SNARE SHORT THROW 13M SML OVAL (MISCELLANEOUS) IMPLANT
SNARE SHORT THROW 30M LRG OVAL (MISCELLANEOUS) IMPLANT
SNARE SNG USE RND 15MM (INSTRUMENTS) IMPLANT
SPOT EX ENDOSCOPIC TATTOO (MISCELLANEOUS)
TRAP ETRAP POLY (MISCELLANEOUS) IMPLANT
VARIJECT INJECTOR VIN23 (MISCELLANEOUS)
WATER STERILE IRR 250ML POUR (IV SOLUTION) ×2 IMPLANT

## 2018-07-16 NOTE — Op Note (Signed)
[facility] Patient Name: Kristine Strickland Procedure Date: 07/16/2018 9:34 AM MRN: [id number] Account #: [id number] Date of Birth: November 17, 1956 Admit Type: Outpatient Age: 62 [facility] Gender: Female Note Status: Finalized Procedure:            Colonoscopy Indications:          Screening for colorectal malignant neoplasm Providers:            [name] MD, MD Referring MD:         Margaretann Loveless (Referring MD) Medicines:            Propofol per Anesthesia Complications:        No immediate complications. Procedure:            Pre-Anesthesia Assessment:                       - Prior to the procedure, a History and Physical was                        performed, and patient medications and allergies were                        reviewed. The patient's tolerance of previous                        anesthesia was also reviewed. The risks and benefits of                        the procedure and the sedation options and risks were                        discussed with the patient. All questions were                        answered, and informed consent was obtained. Prior                        Anticoagulants: The patient has taken no previous                        anticoagulant or antiplatelet agents. ASA Grade                        Assessment: II - A patient with mild systemic disease.                        After reviewing the risks and benefits, the patient was                        deemed in satisfactory condition to undergo the                        procedure.                       After obtaining informed consent, the colonoscope was                        passed under direct vision. Throughout the procedure,  the patient's blood pressure, pulse, and oxygen                        saturations were monitored continuously. The was                        introduced through the anus and advanced to the the                     cecum, identified by appendiceal orifice and ileocecal                        valve. The colonoscopy was performed without                        difficulty. The patient tolerated the procedure well.                        The quality of the bowel preparation was excellent. Findings:      The perianal and digital rectal examinations were normal.      Non-bleeding internal hemorrhoids were found during retroflexion. The       hemorrhoids were Grade I (internal hemorrhoids that do not prolapse). Impression:           - Non-bleeding internal hemorrhoids.                       - No specimens collected. Recommendation:       - Discharge patient to home.                       - Resume previous diet.                       - Continue present medications.                       - Repeat colonoscopy in 10 years for screening unless                        any change in family history or lower GI problems. Procedure Code(s):    --- Professional ---                       [phone], Colonoscopy, flexible; diagnostic, including                        collection of specimen(s) by brushing or washing, when                        performed (separate procedure) Diagnosis Code(s):    --- Professional ---                       Z12.11, Encounter for screening for malignant neoplasm                        of colon CPT copyright 2017 American Medical Association. All rights reserved. The codes documented in this report are preliminary and upon coder review may  be revised to meet current compliance requirements. [name] MD, MD 07/16/2018 10:03:02 AM This report has been signed electronically. Number of Addenda: 0 Note Initiated On:  07/16/2018 9:34 AM Scope Withdrawal Time: 0 hours 7 minutes 48 seconds  Total Procedure Duration: 0 hours 11 minutes 48 seconds       [facility]

## 2018-07-16 NOTE — Anesthesia Preprocedure Evaluation (Signed)
Anesthesia Evaluation    Airway Mallampati: II  TM Distance: >3 FB Neck ROM: Full    Dental no notable dental hx.    Pulmonary neg pulmonary ROS,    Pulmonary exam normal breath sounds clear to auscultation       Cardiovascular hypertension, Normal cardiovascular exam Rhythm:Regular Rate:Normal  Stress Echo Study Conclusions  - Stress ECG conclusions: There were no stress arrhythmias or   conduction abnormalities. The stress ECG was normal. - Staged echo: There was no echocardiographic evidence for   stress-induced ischemia. - Baseline: The estimated LV ejection fraction was 60-65%. - Peak stress: No evidence for new LV regional wall motion   abnormalities.  Impressions:  - Normal study after maximal exercise. Hypertension at rest   (147/103), peak pressures 224/90   target heart rate achieved, 11.3 METS   Good heart rate recovery at 3 min    Neuro/Psych negative neurological ROS     GI/Hepatic GERD  Controlled,  Endo/Other  Hypothyroidism   Renal/GU   negative genitourinary   Musculoskeletal  (+) Arthritis ,   Abdominal   Peds  Hematology   Anesthesia Other Findings   Reproductive/Obstetrics                             Anesthesia Physical Anesthesia Plan  ASA: II  Anesthesia Plan: General   Post-op Pain Management:    Induction: Intravenous  PONV Risk Score and Plan:   Airway Management Planned: Natural Airway  Additional Equipment:   Intra-op Plan:   Post-operative Plan: Extubation in OR  Informed Consent: I have reviewed the patients History and Physical, chart, labs and discussed the procedure including the risks, benefits and alternatives for the proposed anesthesia with the patient or authorized representative who has indicated his/her understanding and acceptance.   Dental advisory given  Plan Discussed with: CRNA  Anesthesia Plan Comments:          Anesthesia Quick Evaluation

## 2018-07-16 NOTE — Anesthesia Procedure Notes (Signed)
Procedure Name: MAC Date/Time: 07/16/2018 9:45 AM Performed by: Lind Guest, CRNA Pre-anesthesia Checklist: Patient identified, Emergency Drugs available, Suction available, Patient being monitored and Timeout performed Patient Re-evaluated:Patient Re-evaluated prior to induction Oxygen Delivery Method: Nasal cannula

## 2018-07-16 NOTE — H&P (Signed)
Midge Miniumarren Kailash Hinze, MD [facility] [address] [place], [place] [address] Phone: [phone] Fax : [phone]  Primary Care Physician:  Margaretann LovelessBurnette, Jennifer M, PA-C Primary Gastroenterologist:  Dr. Servando SnareWohl  Pre-Procedure History & Physical: HPI:  Kristine Strickland is a 62 y.o. female is here for a screening colonoscopy.   Past Medical History:  Diagnosis Date  . Arthritis    right hip  . Family history of adverse reaction to anesthesia    Mother - hard to wake  . GERD (gastroesophageal reflux disease)   . Hypothyroidism   . Thyroid disease    hypothyroidism    Past Surgical History:  Procedure Laterality Date  . BLADDER STEM    . CHOLECYSTECTOMY    . COLONOSCOPY    . DILATION AND CURETTAGE OF UTERUS      Prior to Admission medications   Medication Sig Start Date End Date Taking? Authorizing Provider  aspirin 81 MG tablet Take 1 tablet by mouth daily.   Yes [provider]  Cholecalciferol (VITAMIN D) 2000 units CAPS Take by mouth daily.    Yes [provider]  dexlansoprazole (DEXILANT) 60 MG capsule Take 1 capsule (60 mg total) by mouth daily. 03/10/18  Yes Midge MiniumWohl, Elim Economou, MD  docusate sodium (COLACE) 100 MG capsule Take 2 capsules by mouth daily.   Yes [provider]  levothyroxine (SYNTHROID, LEVOTHROID) 112 MCG tablet TAKE (1) TABLET DAILY FOR THYROID. 09/01/17  Yes Margaretann LovelessBurnette, Jennifer M, PA-C  Melatonin 5 MG TABS Take 10 mg by mouth at bedtime.    Yes [provider]  calcium carbonate (TUMS - DOSED IN MG ELEMENTAL CALCIUM) 500 MG chewable tablet Chew 1 tablet by mouth daily.     [provider]  hyoscyamine (LEVSIN SL) 0.125 MG SL tablet Place 1 tablet (0.125 mg total) under the tongue every 4 (four) hours as needed. Patient not taking: Reported on 07/08/2018 01/06/18   Midge MiniumWohl, Jaquala Fuller, MD    Allergies as of 06/11/2018 - Review Complete 01/25/2018  Allergen Reaction Noted  . Codeine Other (See Comments) 07/23/2015  . Penicillin g  Other (See Comments) 06/02/2015  . Penicillins Other (See Comments) 07/23/2015  . Sulfa antibiotics Other (See Comments) 07/23/2015    Family History  Problem Relation Age of Onset  . Hypertension Mother   . Hyperlipidemia Mother   . Epilepsy Mother   . Pancreatic cancer Maternal Grandmother   . Hypertension Maternal Grandmother   . Heart failure Maternal Grandfather   . Heart attack Paternal Grandfather   . Hypertension Paternal Grandfather   . Hypothyroidism Sister   . Diverticulitis Sister   . Alcohol abuse Brother   . Breast cancer Maternal Aunt [id number]    Social History   Socioeconomic History  . Marital status: Married    Spouse name: Not on file  . Number of children: Not on file  . Years of education: Not on file  . Highest education level: Not on file  Occupational History  . Not on file  Social Needs  . Financial resource strain: Not on file  . Food insecurity:    Worry: Not on file    Inability: Not on file  . Transportation needs:    Medical: Not on file    Non-medical: Not on file  Tobacco Use  . Smoking status: Never Smoker  . Smokeless tobacco: Never Used  Substance and Sexual Activity  . Alcohol use: No    Alcohol/week: 0.0 oz  . Drug use: No  .  Sexual activity: Not on file  Lifestyle  . Physical activity:    Days per week: Not on file    Minutes per session: Not on file  . Stress: Not on file  Relationships  . Social connections:    Talks on phone: Not on file    Gets together: Not on file    Attends religious service: Not on file    Active member of club or organization: Not on file    Attends meetings of clubs or organizations: Not on file    Relationship status: Not on file  . Intimate partner violence:    Fear of current or ex partner: Not on file    Emotionally abused: Not on file    Physically abused: Not on file    Forced sexual activity: Not on file  Other Topics Concern  . Not on file  Social History Narrative  . Not on file     Review of Systems: See HPI, otherwise negative ROS  Physical Exam: BP (!) 163/86   Pulse 70   Temp 97.9 F (36.6 C) (Temporal)   Resp 16   Ht 5\' 4"  (1.626 m)   Wt 156 lb (70.8 kg)   SpO2 100%   BMI 26.78 kg/m  General:   Alert,  pleasant and cooperative in NAD Head:  Normocephalic and atraumatic. Neck:  Supple; no masses or thyromegaly. Lungs:  Clear throughout to auscultation.    Heart:  Regular rate and rhythm. Abdomen:  Soft, nontender and nondistended. Normal bowel sounds, without guarding, and without rebound.   Neurologic:  Alert and  oriented x4;  grossly normal neurologically.  Impression/Plan: Kristine Strickland is now here to undergo a screening colonoscopy.  Risks, benefits, and alternatives regarding colonoscopy have been reviewed with the patient.  Questions have been answered.  All parties agreeable.

## 2018-07-16 NOTE — Anesthesia Postprocedure Evaluation (Signed)
Anesthesia Post Note  Patient: Kristine Strickland  Procedure(s) Performed: COLONOSCOPY WITH PROPOFOL (N/A Rectum)  Patient location during evaluation: PACU Anesthesia Type: General Level of consciousness: awake and alert Pain management: pain level controlled Vital Signs Assessment: post-procedure vital signs reviewed and stable Respiratory status: spontaneous breathing, nonlabored ventilation, respiratory function stable and patient connected to nasal cannula oxygen Cardiovascular status: blood pressure returned to baseline and stable Postop Assessment: no apparent nausea or vomiting Anesthetic complications: no    Samule Life C

## 2018-07-16 NOTE — Transfer of Care (Signed)
Immediate Anesthesia Transfer of Care Note  Patient: Kristine Strickland  Procedure(s) Performed: COLONOSCOPY WITH PROPOFOL (N/A Rectum)  Patient Location: PACU  Anesthesia Type: General  Level of Consciousness: awake, alert  and patient cooperative  Airway and Oxygen Therapy: Patient Spontanous Breathing and Patient connected to supplemental oxygen  Post-op Assessment: Post-op Vital signs reviewed, Patient's Cardiovascular Status Stable, Respiratory Function Stable, Patent Airway and No signs of Nausea or vomiting  Post-op Vital Signs: Reviewed and stable  Complications: No apparent anesthesia complications

## 2018-07-30 ENCOUNTER — Other Ambulatory Visit: Payer: Self-pay | Admitting: Physician Assistant

## 2018-07-30 DIAGNOSIS — E039 Hypothyroidism, unspecified: Secondary | ICD-10-CM

## 2018-12-16 IMAGING — CR DG ABDOMEN ACUTE W/ 1V CHEST
1 series · 3 of 3 positions shown · non-contrast
Comparison: None

CLINICAL DATA: Epigastric pain since this morning, history GERD and
cholecystectomy

EXAM:
DG ABDOMEN ACUTE W/ 1V CHEST

[Series 1: dg abd acute w/chest · 0.14mm/px · 3 of 3 slices shown]
[im 1/3]
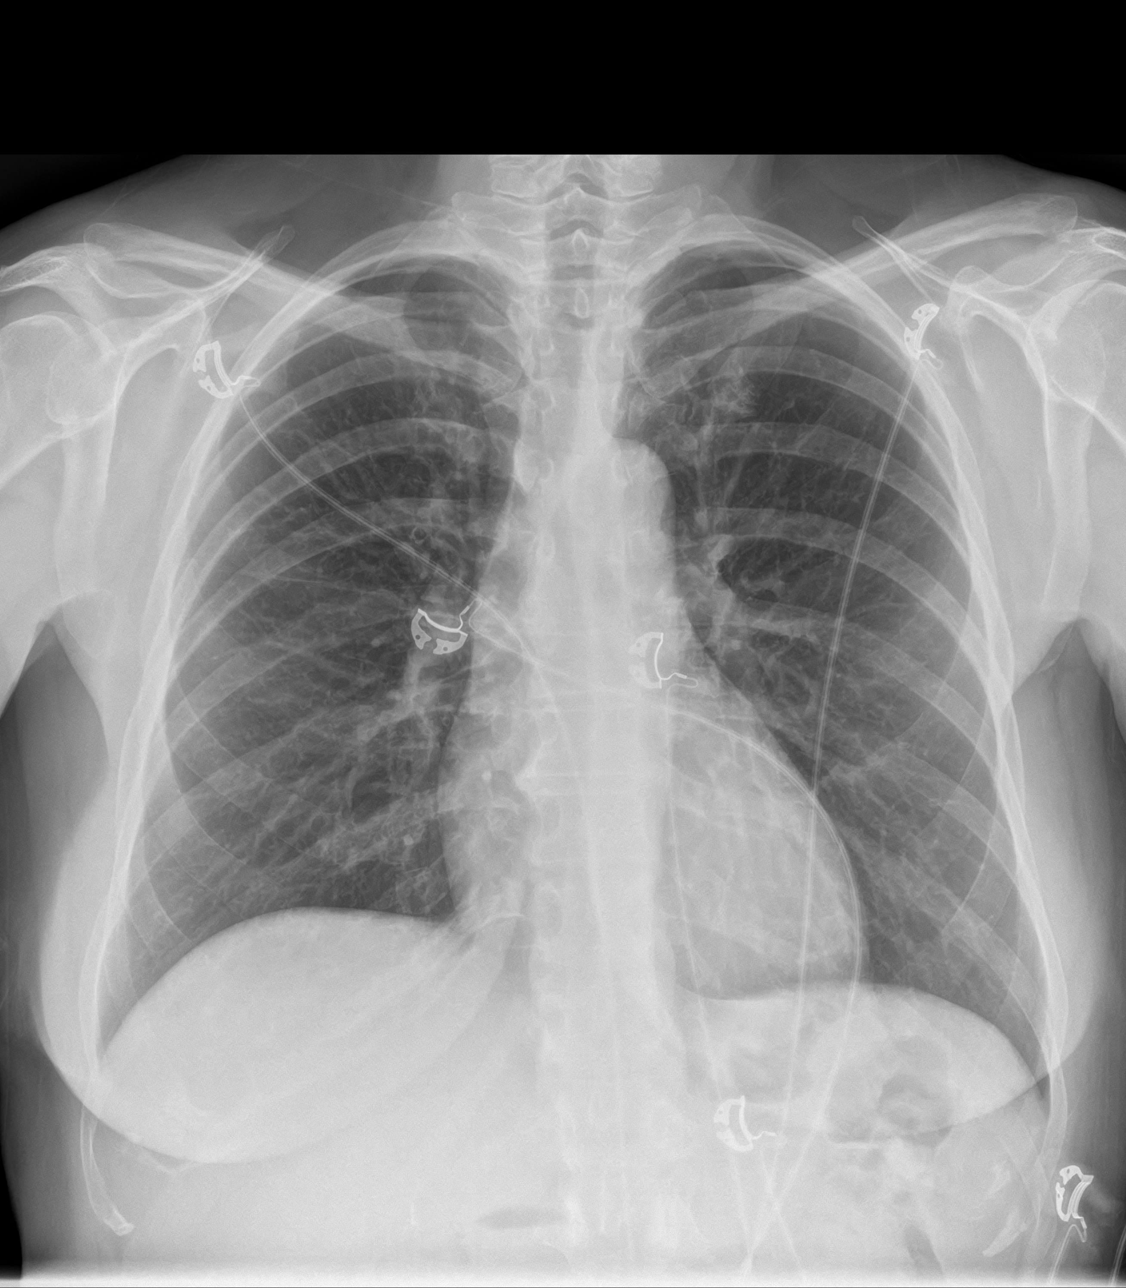
[im 2/3]
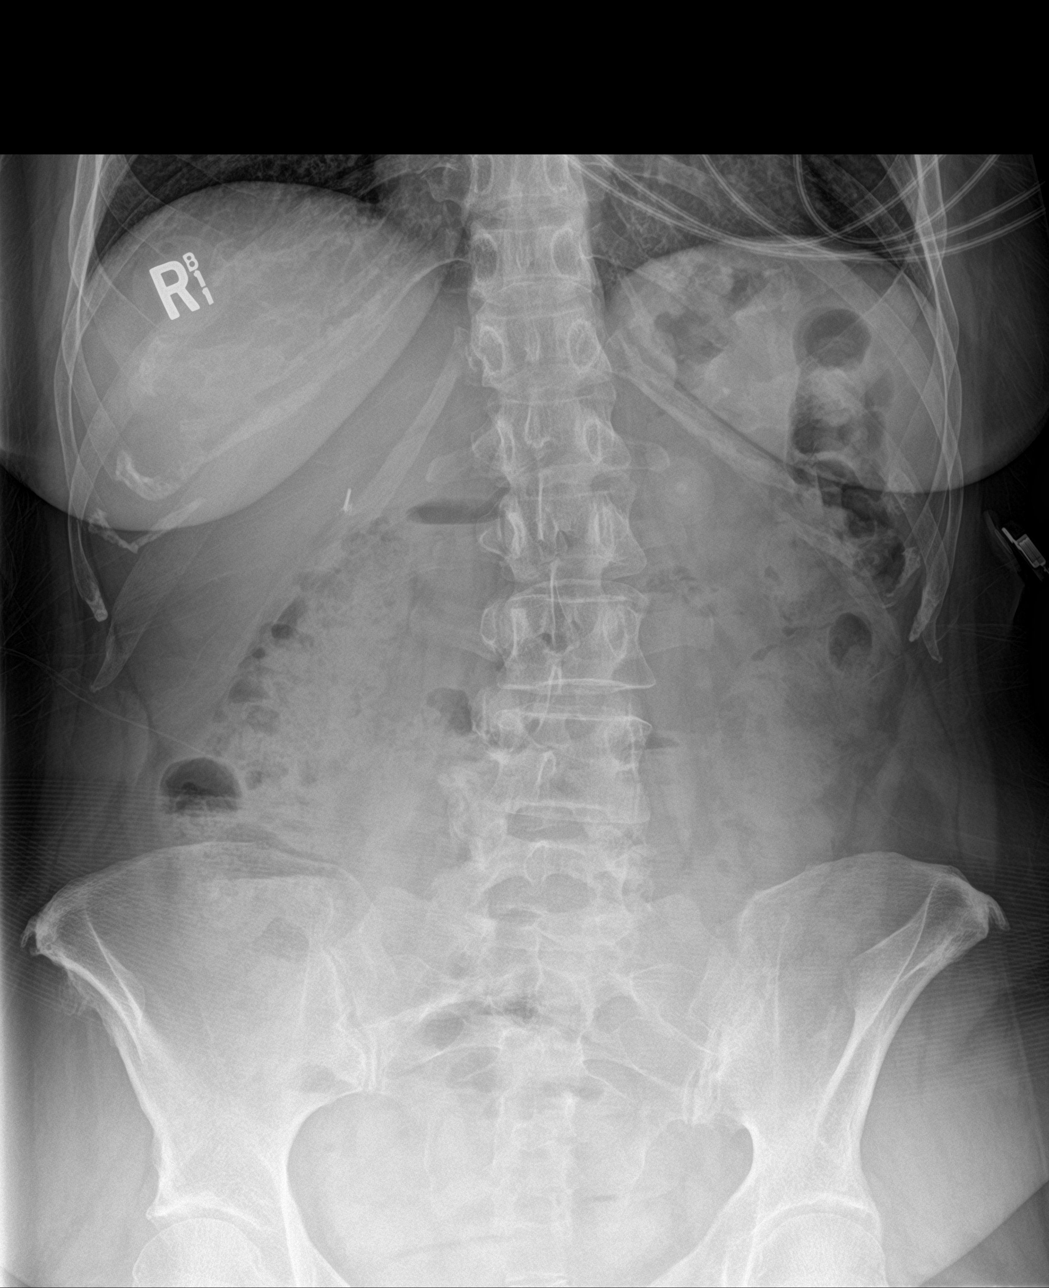
[im 3/3]
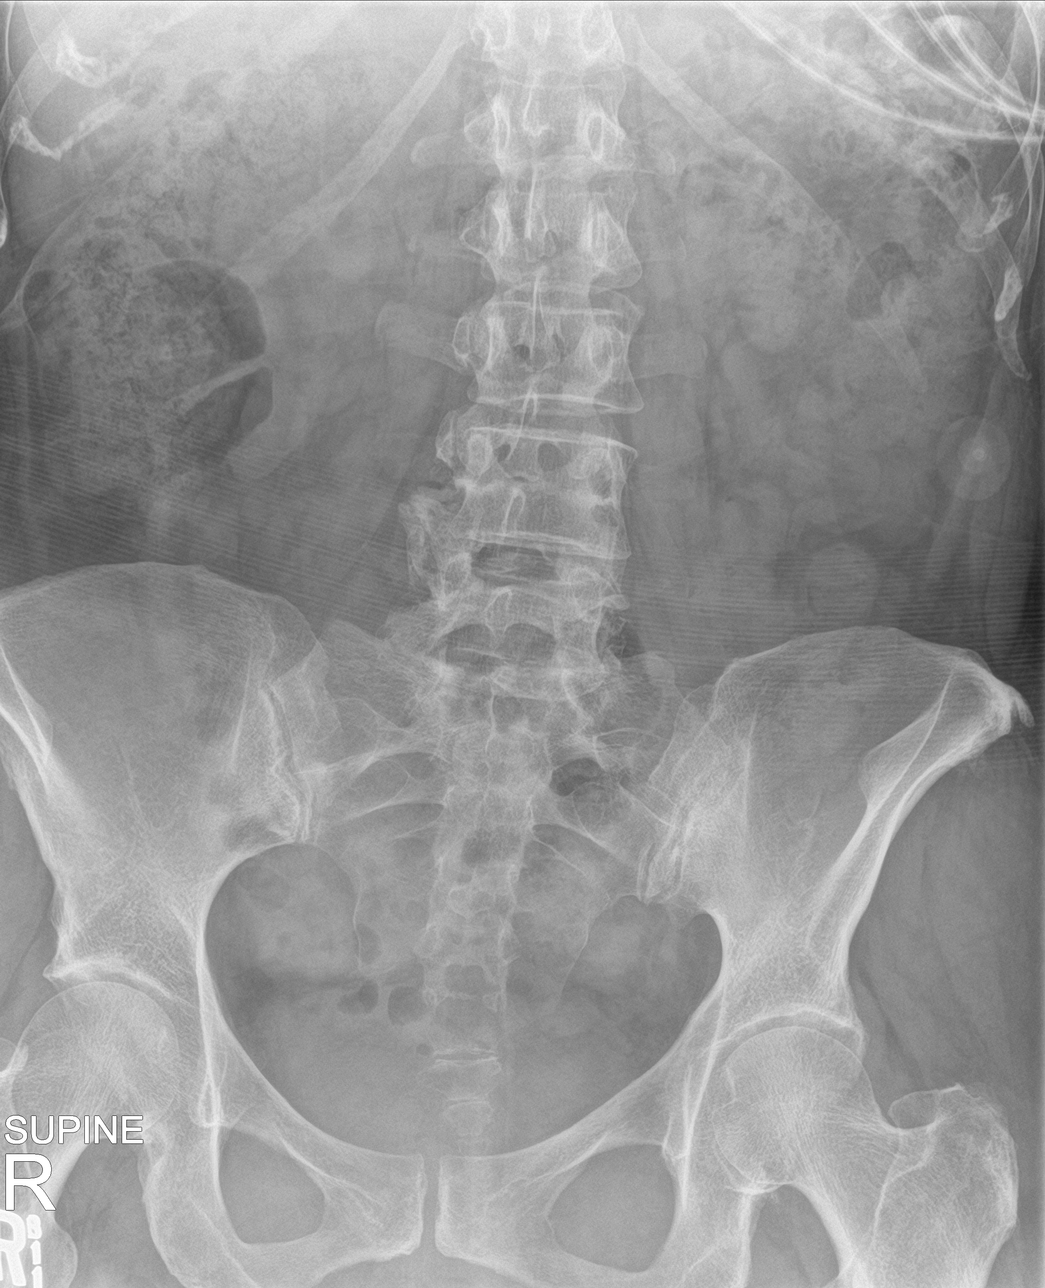

[3 of 3 positions shown; findings below may reference images not displayed]

FINDINGS: Normal heart size, mediastinal contours, and pulmonary vascularity.

Minimal bronchitic changes without infiltrate, pleural effusion or
pneumothorax.

Surgical clips RIGHT upper quadrant from cholecystectomy.

Nonobstructive bowel gas pattern.

Mildly prominent stool in the proximal half of colon.

No bowel dilatation, bowel wall thickening or free air.

Bones demineralized.

No urinary tract calcification.
IMPRESSION: Mildly prominent stool in the proximal half of the colon.

Minimal bronchitic changes.

Otherwise negative exam.

## 2018-12-30 DIAGNOSIS — L821 Other seborrheic keratosis: Secondary | ICD-10-CM | POA: Diagnosis not present

## 2018-12-30 DIAGNOSIS — D225 Melanocytic nevi of trunk: Secondary | ICD-10-CM | POA: Diagnosis not present

## 2018-12-30 DIAGNOSIS — D2262 Melanocytic nevi of left upper limb, including shoulder: Secondary | ICD-10-CM | POA: Diagnosis not present

## 2018-12-30 DIAGNOSIS — L728 Other follicular cysts of the skin and subcutaneous tissue: Secondary | ICD-10-CM | POA: Diagnosis not present

## 2019-01-21 DIAGNOSIS — R208 Other disturbances of skin sensation: Secondary | ICD-10-CM | POA: Diagnosis not present

## 2019-01-21 DIAGNOSIS — L720 Epidermal cyst: Secondary | ICD-10-CM | POA: Diagnosis not present

## 2019-01-21 DIAGNOSIS — L538 Other specified erythematous conditions: Secondary | ICD-10-CM | POA: Diagnosis not present

## 2019-01-21 DIAGNOSIS — L728 Other follicular cysts of the skin and subcutaneous tissue: Secondary | ICD-10-CM | POA: Diagnosis not present

## 2019-02-08 IMAGING — CR DG CHEST 2V
1 series · 2 of 2 positions shown · non-contrast
Comparison: None.

CLINICAL DATA: Fever, chills, weakness for 1 day

EXAM:
CHEST  2 VIEW

[Series 1: dg chest 2 view · 0.14mm/px · 2 of 2 slices shown]
[im 1/2]
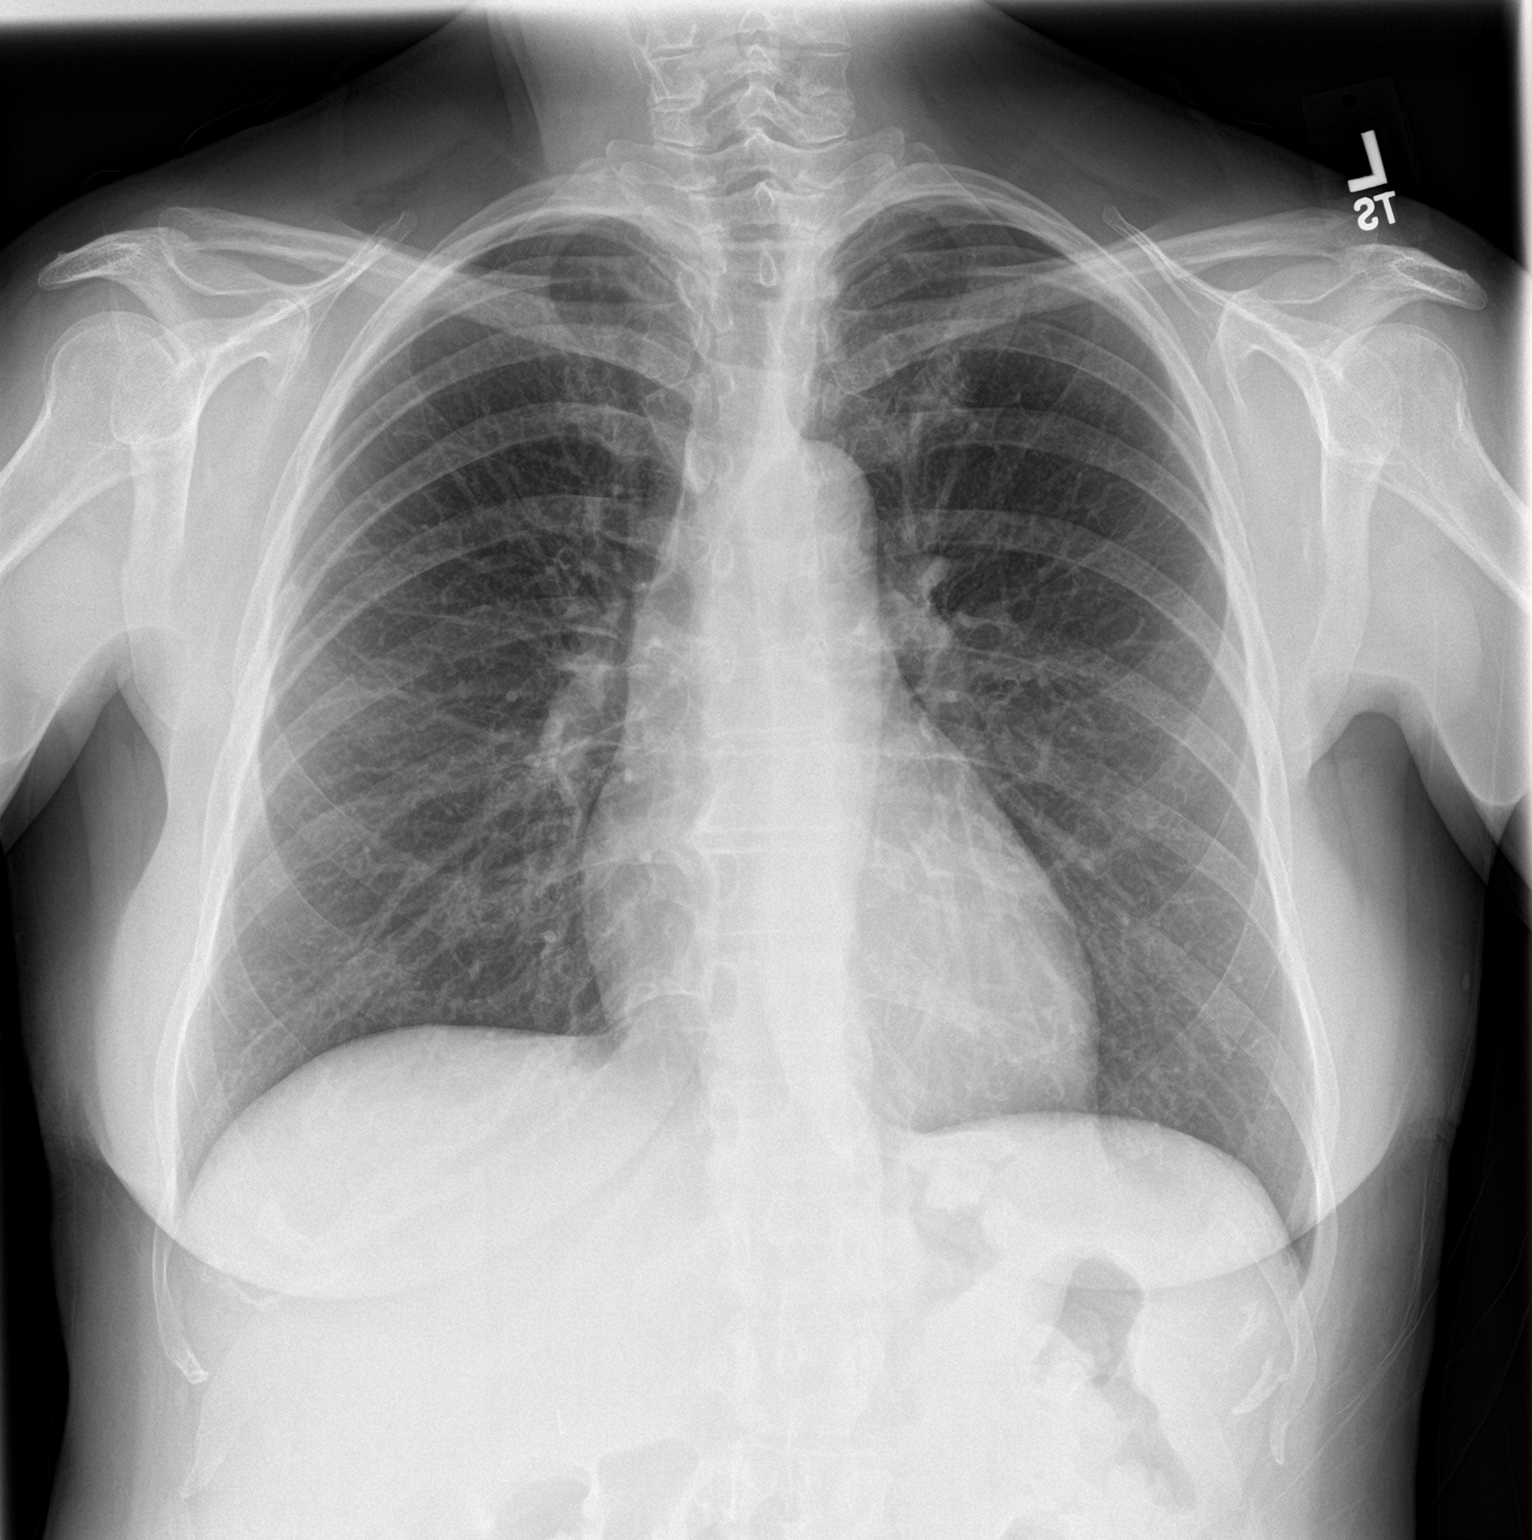
[im 2/2]
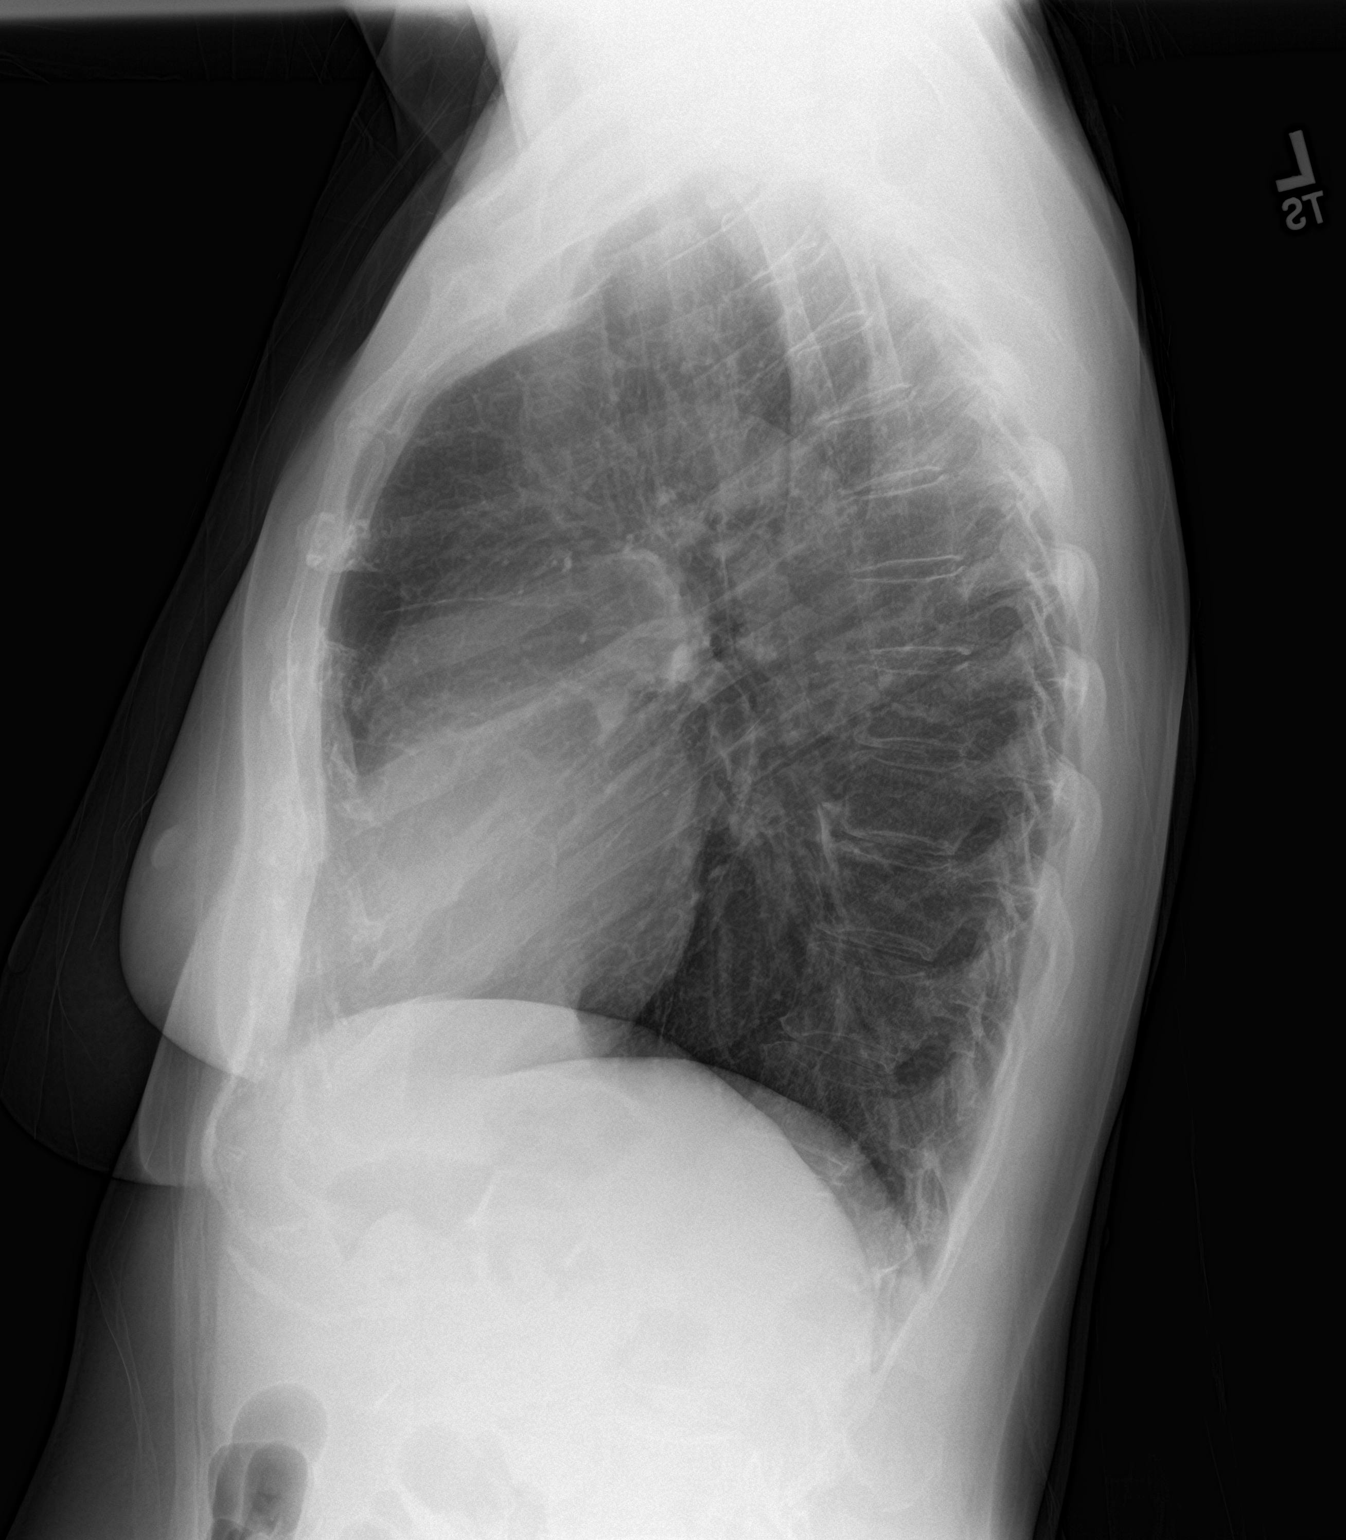

[2 of 2 positions shown; findings below may reference images not displayed]

FINDINGS: Normal heart size. Lungs clear. No pneumothorax. No pleural
effusion.
IMPRESSION: No active cardiopulmonary disease.

## 2019-02-14 DIAGNOSIS — E669 Obesity, unspecified: Secondary | ICD-10-CM | POA: Diagnosis not present

## 2019-02-14 DIAGNOSIS — E039 Hypothyroidism, unspecified: Secondary | ICD-10-CM | POA: Diagnosis not present

## 2019-02-14 DIAGNOSIS — K224 Dyskinesia of esophagus: Secondary | ICD-10-CM | POA: Diagnosis not present

## 2019-02-14 DIAGNOSIS — R030 Elevated blood-pressure reading, without diagnosis of hypertension: Secondary | ICD-10-CM | POA: Diagnosis not present

## 2019-02-15 ENCOUNTER — Other Ambulatory Visit: Payer: Self-pay | Admitting: Internal Medicine

## 2019-02-15 DIAGNOSIS — Z1239 Encounter for other screening for malignant neoplasm of breast: Secondary | ICD-10-CM

## 2019-03-03 ENCOUNTER — Other Ambulatory Visit: Payer: Self-pay

## 2019-03-03 ENCOUNTER — Ambulatory Visit
Admission: RE | Admit: 2019-03-03 | Discharge: 2019-03-03 | Disposition: A | Discharge status: Home / Self Care | Payer: BLUE CROSS/BLUE SHIELD | Source: Ambulatory Visit | Attending: Internal Medicine | Admitting: Internal Medicine

## 2019-03-03 DIAGNOSIS — Z1231 Encounter for screening mammogram for malignant neoplasm of breast: Secondary | ICD-10-CM | POA: Diagnosis not present

## 2019-03-03 DIAGNOSIS — Z1239 Encounter for other screening for malignant neoplasm of breast: Secondary | ICD-10-CM

## 2019-05-20 DIAGNOSIS — Z1389 Encounter for screening for other disorder: Secondary | ICD-10-CM | POA: Diagnosis not present

## 2019-05-20 DIAGNOSIS — I10 Essential (primary) hypertension: Secondary | ICD-10-CM | POA: Diagnosis not present

## 2019-05-20 DIAGNOSIS — E669 Obesity, unspecified: Secondary | ICD-10-CM | POA: Diagnosis not present

## 2019-05-20 DIAGNOSIS — E039 Hypothyroidism, unspecified: Secondary | ICD-10-CM | POA: Diagnosis not present

## 2020-02-29 DIAGNOSIS — Z0000 Encounter for general adult medical examination without abnormal findings: Secondary | ICD-10-CM | POA: Diagnosis not present

## 2020-02-29 DIAGNOSIS — E559 Vitamin D deficiency, unspecified: Secondary | ICD-10-CM | POA: Diagnosis not present

## 2020-02-29 DIAGNOSIS — R7303 Prediabetes: Secondary | ICD-10-CM | POA: Diagnosis not present

## 2020-02-29 DIAGNOSIS — I10 Essential (primary) hypertension: Secondary | ICD-10-CM | POA: Diagnosis not present

## 2020-02-29 DIAGNOSIS — E039 Hypothyroidism, unspecified: Secondary | ICD-10-CM | POA: Diagnosis not present

## 2020-03-01 ENCOUNTER — Other Ambulatory Visit: Payer: Self-pay | Admitting: Family Medicine

## 2020-03-01 DIAGNOSIS — Z1231 Encounter for screening mammogram for malignant neoplasm of breast: Secondary | ICD-10-CM

## 2020-03-01 DIAGNOSIS — Z13820 Encounter for screening for osteoporosis: Secondary | ICD-10-CM

## 2020-03-27 ENCOUNTER — Ambulatory Visit
Admission: RE | Admit: 2020-03-27 | Discharge: 2020-03-27 | Disposition: A | Discharge status: Home / Self Care | Payer: BC Managed Care – PPO | Source: Ambulatory Visit | Attending: Family Medicine | Admitting: Family Medicine

## 2020-03-27 DIAGNOSIS — Z780 Asymptomatic menopausal state: Secondary | ICD-10-CM | POA: Diagnosis not present

## 2020-03-27 DIAGNOSIS — Z1231 Encounter for screening mammogram for malignant neoplasm of breast: Secondary | ICD-10-CM | POA: Insufficient documentation

## 2020-03-27 DIAGNOSIS — Z13820 Encounter for screening for osteoporosis: Secondary | ICD-10-CM

## 2020-03-30 DIAGNOSIS — I10 Essential (primary) hypertension: Secondary | ICD-10-CM | POA: Diagnosis not present

## 2020-03-30 DIAGNOSIS — R7303 Prediabetes: Secondary | ICD-10-CM | POA: Diagnosis not present

## 2020-03-30 DIAGNOSIS — K224 Dyskinesia of esophagus: Secondary | ICD-10-CM | POA: Diagnosis not present

## 2020-03-30 DIAGNOSIS — E559 Vitamin D deficiency, unspecified: Secondary | ICD-10-CM | POA: Diagnosis not present

## 2020-11-07 DIAGNOSIS — Z1389 Encounter for screening for other disorder: Secondary | ICD-10-CM | POA: Diagnosis not present

## 2020-11-07 DIAGNOSIS — M25562 Pain in left knee: Secondary | ICD-10-CM | POA: Diagnosis not present

## 2020-11-26 DIAGNOSIS — S86912A Strain of unspecified muscle(s) and tendon(s) at lower leg level, left leg, initial encounter: Secondary | ICD-10-CM | POA: Diagnosis not present

## 2020-11-26 DIAGNOSIS — M13862 Other specified arthritis, left knee: Secondary | ICD-10-CM | POA: Diagnosis not present

## 2021-03-01 ENCOUNTER — Other Ambulatory Visit: Payer: Self-pay | Admitting: Family Medicine

## 2021-03-01 DIAGNOSIS — Z0000 Encounter for general adult medical examination without abnormal findings: Secondary | ICD-10-CM | POA: Diagnosis not present

## 2021-03-01 DIAGNOSIS — Z1231 Encounter for screening mammogram for malignant neoplasm of breast: Secondary | ICD-10-CM

## 2021-03-15 DIAGNOSIS — E039 Hypothyroidism, unspecified: Secondary | ICD-10-CM | POA: Diagnosis not present

## 2021-03-15 DIAGNOSIS — I10 Essential (primary) hypertension: Secondary | ICD-10-CM | POA: Diagnosis not present

## 2021-03-15 DIAGNOSIS — R7303 Prediabetes: Secondary | ICD-10-CM | POA: Diagnosis not present

## 2021-04-05 ENCOUNTER — Other Ambulatory Visit: Payer: Self-pay

## 2021-04-05 ENCOUNTER — Ambulatory Visit
Admission: RE | Admit: 2021-04-05 | Discharge: 2021-04-05 | Disposition: A | Discharge status: Home / Self Care | Payer: BC Managed Care – PPO | Source: Ambulatory Visit | Attending: Family Medicine | Admitting: Family Medicine

## 2021-04-05 DIAGNOSIS — Z1231 Encounter for screening mammogram for malignant neoplasm of breast: Secondary | ICD-10-CM | POA: Insufficient documentation

## 2021-06-18 DIAGNOSIS — E039 Hypothyroidism, unspecified: Secondary | ICD-10-CM | POA: Diagnosis not present

## 2021-06-18 DIAGNOSIS — Z1389 Encounter for screening for other disorder: Secondary | ICD-10-CM | POA: Diagnosis not present

## 2021-06-18 DIAGNOSIS — I10 Essential (primary) hypertension: Secondary | ICD-10-CM | POA: Diagnosis not present

## 2021-06-18 DIAGNOSIS — R300 Dysuria: Secondary | ICD-10-CM | POA: Diagnosis not present

## 2021-06-18 DIAGNOSIS — L299 Pruritus, unspecified: Secondary | ICD-10-CM | POA: Diagnosis not present

## 2021-06-26 DIAGNOSIS — E039 Hypothyroidism, unspecified: Secondary | ICD-10-CM | POA: Diagnosis not present

## 2021-06-26 DIAGNOSIS — R7303 Prediabetes: Secondary | ICD-10-CM | POA: Diagnosis not present

## 2021-06-26 DIAGNOSIS — Z1159 Encounter for screening for other viral diseases: Secondary | ICD-10-CM | POA: Diagnosis not present

## 2021-06-26 DIAGNOSIS — L299 Pruritus, unspecified: Secondary | ICD-10-CM | POA: Diagnosis not present

## 2021-06-28 ENCOUNTER — Other Ambulatory Visit: Payer: Self-pay | Admitting: Family Medicine

## 2021-06-28 ENCOUNTER — Other Ambulatory Visit (HOSPITAL_COMMUNITY): Payer: Self-pay | Admitting: Family Medicine

## 2021-07-03 ENCOUNTER — Encounter: Payer: Self-pay | Admitting: *Deleted

## 2021-07-03 ENCOUNTER — Other Ambulatory Visit: Payer: Self-pay

## 2021-07-04 ENCOUNTER — Encounter: Payer: Self-pay | Admitting: *Deleted

## 2021-07-07 DIAGNOSIS — K224 Dyskinesia of esophagus: Secondary | ICD-10-CM | POA: Diagnosis not present

## 2021-07-07 DIAGNOSIS — K8032 Calculus of bile duct with acute cholangitis without obstruction: Secondary | ICD-10-CM | POA: Diagnosis not present

## 2021-07-07 DIAGNOSIS — R17 Unspecified jaundice: Secondary | ICD-10-CM | POA: Diagnosis not present

## 2021-07-07 DIAGNOSIS — U071 COVID-19: Secondary | ICD-10-CM | POA: Diagnosis not present

## 2021-07-07 DIAGNOSIS — K8030 Calculus of bile duct with cholangitis, unspecified, without obstruction: Secondary | ICD-10-CM | POA: Diagnosis not present

## 2021-07-07 DIAGNOSIS — Z20822 Contact with and (suspected) exposure to covid-19: Secondary | ICD-10-CM | POA: Diagnosis not present

## 2021-07-07 DIAGNOSIS — R0989 Other specified symptoms and signs involving the circulatory and respiratory systems: Secondary | ICD-10-CM | POA: Diagnosis not present

## 2021-07-07 DIAGNOSIS — Z7982 Long term (current) use of aspirin: Secondary | ICD-10-CM | POA: Diagnosis not present

## 2021-07-07 DIAGNOSIS — R000 Tachycardia, unspecified: Secondary | ICD-10-CM | POA: Diagnosis not present

## 2021-07-07 DIAGNOSIS — R932 Abnormal findings on diagnostic imaging of liver and biliary tract: Secondary | ICD-10-CM | POA: Diagnosis not present

## 2021-07-07 DIAGNOSIS — Z880 Allergy status to penicillin: Secondary | ICD-10-CM | POA: Diagnosis not present

## 2021-07-07 DIAGNOSIS — A4151 Sepsis due to Escherichia coli [E. coli]: Secondary | ICD-10-CM | POA: Diagnosis not present

## 2021-07-07 DIAGNOSIS — K838 Other specified diseases of biliary tract: Secondary | ICD-10-CM | POA: Diagnosis not present

## 2021-07-07 DIAGNOSIS — A4150 Gram-negative sepsis, unspecified: Secondary | ICD-10-CM | POA: Diagnosis not present

## 2021-07-07 DIAGNOSIS — Z882 Allergy status to sulfonamides status: Secondary | ICD-10-CM | POA: Diagnosis not present

## 2021-07-07 DIAGNOSIS — K8080 Other cholelithiasis without obstruction: Secondary | ICD-10-CM | POA: Diagnosis not present

## 2021-07-07 DIAGNOSIS — R7401 Elevation of levels of liver transaminase levels: Secondary | ICD-10-CM | POA: Diagnosis not present

## 2021-07-07 DIAGNOSIS — I10 Essential (primary) hypertension: Secondary | ICD-10-CM | POA: Diagnosis not present

## 2021-07-07 DIAGNOSIS — K8050 Calculus of bile duct without cholangitis or cholecystitis without obstruction: Secondary | ICD-10-CM | POA: Diagnosis not present

## 2021-07-07 DIAGNOSIS — E039 Hypothyroidism, unspecified: Secondary | ICD-10-CM | POA: Diagnosis not present

## 2021-07-07 DIAGNOSIS — R748 Abnormal levels of other serum enzymes: Secondary | ICD-10-CM | POA: Diagnosis not present

## 2021-07-07 DIAGNOSIS — R6883 Chills (without fever): Secondary | ICD-10-CM | POA: Diagnosis not present

## 2021-07-07 DIAGNOSIS — R5383 Other fatigue: Secondary | ICD-10-CM | POA: Diagnosis not present

## 2021-07-07 DIAGNOSIS — A419 Sepsis, unspecified organism: Secondary | ICD-10-CM | POA: Diagnosis not present

## 2021-07-07 DIAGNOSIS — R0602 Shortness of breath: Secondary | ICD-10-CM | POA: Diagnosis not present

## 2021-07-07 DIAGNOSIS — Z8616 Personal history of COVID-19: Secondary | ICD-10-CM | POA: Diagnosis not present

## 2021-07-07 DIAGNOSIS — R531 Weakness: Secondary | ICD-10-CM | POA: Diagnosis not present

## 2021-07-07 DIAGNOSIS — Z800 Family history of malignant neoplasm of digestive organs: Secondary | ICD-10-CM | POA: Diagnosis not present

## 2021-07-07 DIAGNOSIS — R110 Nausea: Secondary | ICD-10-CM | POA: Diagnosis not present

## 2021-07-07 DIAGNOSIS — R197 Diarrhea, unspecified: Secondary | ICD-10-CM | POA: Diagnosis not present

## 2021-07-07 DIAGNOSIS — Z79899 Other long term (current) drug therapy: Secondary | ICD-10-CM | POA: Diagnosis not present

## 2021-07-07 DIAGNOSIS — Z885 Allergy status to narcotic agent status: Secondary | ICD-10-CM | POA: Diagnosis not present

## 2021-07-07 DIAGNOSIS — R509 Fever, unspecified: Secondary | ICD-10-CM | POA: Diagnosis not present

## 2021-07-08 DIAGNOSIS — K224 Dyskinesia of esophagus: Secondary | ICD-10-CM | POA: Insufficient documentation

## 2021-07-08 DIAGNOSIS — K831 Obstruction of bile duct: Secondary | ICD-10-CM | POA: Insufficient documentation

## 2021-07-10 DIAGNOSIS — M7989 Other specified soft tissue disorders: Secondary | ICD-10-CM | POA: Insufficient documentation

## 2021-07-16 ENCOUNTER — Ambulatory Visit: Payer: BC Managed Care – PPO

## 2021-09-04 ENCOUNTER — Telehealth: Payer: Self-pay | Admitting: Family Medicine

## 2021-09-04 ENCOUNTER — Telehealth: Payer: Self-pay | Admitting: Internal Medicine

## 2021-09-04 NOTE — Telephone Encounter (Signed)
Ok to schedule as a new pt.   

## 2021-09-04 NOTE — Telephone Encounter (Signed)
Message sent to provider 

## 2021-09-04 NOTE — Telephone Encounter (Signed)
Patient is a Nancy's Swiatek's daughter in law. She would like to know if Dr Lorin Picket will take her on as a new patient.

## 2021-09-04 NOTE — Telephone Encounter (Signed)
Called pt to setup new patient appt/ok'd by Dr Lorin Picket on 09/04/2021

## 2021-09-12 ENCOUNTER — Ambulatory Visit: Payer: BC Managed Care – PPO | Admitting: Gastroenterology

## 2021-09-27 DIAGNOSIS — R7303 Prediabetes: Secondary | ICD-10-CM | POA: Diagnosis not present

## 2021-09-27 DIAGNOSIS — Z23 Encounter for immunization: Secondary | ICD-10-CM | POA: Diagnosis not present

## 2021-09-27 DIAGNOSIS — D649 Anemia, unspecified: Secondary | ICD-10-CM | POA: Diagnosis not present

## 2021-09-27 DIAGNOSIS — I10 Essential (primary) hypertension: Secondary | ICD-10-CM | POA: Diagnosis not present

## 2021-09-27 DIAGNOSIS — Z1389 Encounter for screening for other disorder: Secondary | ICD-10-CM | POA: Diagnosis not present

## 2021-09-27 DIAGNOSIS — Z0000 Encounter for general adult medical examination without abnormal findings: Secondary | ICD-10-CM | POA: Diagnosis not present

## 2021-12-24 ENCOUNTER — Other Ambulatory Visit: Payer: Self-pay

## 2021-12-25 ENCOUNTER — Ambulatory Visit (INDEPENDENT_AMBULATORY_CARE_PROVIDER_SITE_OTHER): Payer: BC Managed Care – PPO | Admitting: Gastroenterology

## 2021-12-25 ENCOUNTER — Encounter: Payer: Self-pay | Admitting: Gastroenterology

## 2021-12-25 ENCOUNTER — Ambulatory Visit: Payer: BC Managed Care – PPO | Admitting: Gastroenterology

## 2021-12-25 VITALS — BP 165/94 | HR 80 | Temp 98.1°F | Ht 64.0 in | Wt 209.0 lb

## 2021-12-25 DIAGNOSIS — R7989 Other specified abnormal findings of blood chemistry: Secondary | ICD-10-CM | POA: Diagnosis not present

## 2021-12-25 DIAGNOSIS — R9389 Abnormal findings on diagnostic imaging of other specified body structures: Secondary | ICD-10-CM

## 2021-12-25 NOTE — Progress Notes (Signed)
Wyline Mood MD, MRCP(U.K) [address]  [address]  [place], [place] [address]  Main: [phone]  Fax: [phone]   Gastroenterology Consultation  Referring Provider:     Lorn Junes, FNP Primary Care Physician:  Lorn Junes, FNP Primary Gastroenterologist:  Dr. Wyline Mood  Reason for Consultation: Issues with bilirubin        HPI:   Kristine Strickland is a 66 y.o. y/o female referred for consultation & management  by Lorn Junes, FNP.    Prior history reviewed and summarized  I try to obtain Bactrim for her recent ERCP in July 2022 on Care Everywhere but currently seen in the procedure note.  She underwent an ERCP on 07/08/2021 at [facility].  The note stated that she had choledocholithiasis and had to have 2 large stones taken out from the common bile duct.  She presented to [facility] with sepsis and E. coli bacteremia in the setting of choledocholithiasis and acute cholangitis.  Treated with antibiotics and on 718 underwent sphincterotomy with removal of 2 large stones and intra ampullary biopsies.  Treat with antibiotics and discharged home.  CT abdomen on 07/08/2021 showed densities in the distal common bile duct with dilation of the extra and intrahepatic biliary ducts consistent with choledocholithiasis.  2 soft tissue deposits were seen in the region of the Morison's pouch and further follow-up was recommended.  07/10/2021 CMP demonstrated an elevated bilirubin of 1.6, alkaline phosphatase of 144, ALT 142 and AST of 50.  Hemoglobin of 10.4 g.  07/08/2021 ampullary biopsy acute inflammation and reactive atypia no malignancy noted.   She states that she is here today just to follow-up on the issue with a modest infarction sure her bilirubin is back to normal.  She denies any abdominal pain.  Denies any fever.  Denies any dyspeptic symptoms.  She does not have a gallbladder it has been taken off previously.  Past Medical History:  Diagnosis Date   Arthritis     right hip   Family history of adverse reaction to anesthesia    Mother - hard to wake   GERD (gastroesophageal reflux disease)    Hypothyroidism    Thyroid disease    hypothyroidism    Past Surgical History:  Procedure Laterality Date   BLADDER STEM     CHOLECYSTECTOMY     COLONOSCOPY     COLONOSCOPY WITH PROPOFOL N/A 07/16/2018   Procedure: COLONOSCOPY WITH PROPOFOL;  Surgeon: [name], MD;  [facility];  Service: Endoscopy;  Laterality: N/A;   DILATION AND CURETTAGE OF UTERUS      Prior to Admission medications   Medication Sig Start Date End Date Taking? Authorizing Provider  aspirin 81 MG EC tablet Take 1 tablet by mouth daily.    [provider]  cetirizine (ZYRTEC) 10 MG tablet Take 1 tablet by mouth daily.    [provider]  Cholecalciferol 50 MCG (2000 UT) CAPS Take 1 capsule by mouth daily.    [provider]  docusate sodium (COLACE) 100 MG capsule Take 2 capsules by mouth daily.    [provider]  esomeprazole (NEXIUM) 20 MG capsule Take 20 mg by mouth daily. 09/30/21   [provider]  GOODSENSE ARTHRITIS PAIN 1 % GEL SMARTSIG:2-4 Gram(s) Topical 1-4 Times Daily PRN 09/30/21   [provider]  levothyroxine (SYNTHROID) 112 MCG tablet Take 1 tablet by mouth daily. 07/30/18   [provider]  losartan (COZAAR) 50  MG tablet Take 50 mg by mouth daily. 07/15/21   [provider]  losartan (COZAAR) 50 MG tablet Take 1 tablet by mouth 1 day or 1 dose. 04/18/21   [provider]  Melatonin 10 MG TABS Take 1 tablet by mouth daily.    [provider]  meloxicam (MOBIC) 15 MG tablet Take 1 tablet by mouth daily.    [provider]    Family History  Problem Relation Age of Onset   Hypertension Mother    Hyperlipidemia Mother    Epilepsy Mother    Pancreatic cancer Maternal Grandmother    Hypertension Maternal Grandmother    Heart failure Maternal Grandfather     Heart attack Paternal Grandfather    Hypertension Paternal Grandfather    Hypothyroidism Sister    Diverticulitis Sister    Alcohol abuse Brother    Breast cancer Maternal Aunt 71     Social History   Tobacco Use   Smoking status: Never   Smokeless tobacco: Never  Vaping Use   Vaping Use: Never used  Substance Use Topics   Alcohol use: No    Alcohol/week: 0.0 standard drinks   Drug use: No    Allergies as of 12/25/2021 - Review Complete 12/25/2021  Allergen Reaction Noted   Codeine Nausea And Vomiting 07/23/2015   Lovastatin Cough 12/24/2021   Sulfa antibiotics Nausea And Vomiting 07/23/2015   Penicillin g Rash 06/02/2015   Penicillins Rash 07/23/2015    Review of Systems:    All systems reviewed and negative except where noted in HPI.   Physical Exam:  BP (!) 165/94    Pulse 80    Temp 98.1 F (36.7 C) (Oral)    Ht 5\' 4"  (1.626 m)    Wt 209 lb (94.8 kg)    BMI 35.87 kg/m  No LMP recorded. Patient is postmenopausal. Psych:  Alert and cooperative. Normal mood and affect. General:   Alert,  Well-developed, well-nourished, pleasant and cooperative in NAD Head:  Normocephalic and atraumatic. Abdomen:  Normal bowel sounds.  No bruits.  Soft, non-tender and non-distended without masses, hepatosplenomegaly or hernias noted.  No guarding or rebound tenderness.    Neurologic:  Alert and oriented x3;  grossly normal neurologically. Psych:  Alert and cooperative. Normal mood and affect.  Imaging Studies: No results found.  Assessment and Plan:   Kristine Strickland is a 66 y.o. y/o female has been referred for abnormal bilirubin levels.  It appears that in July 2022 she was admitted to [facility] with acute cholangitis secondary to choledocholithiasis and E. coli bacteremia.  She underwent an ERCP with sphincterotomy and stone extraction.  At that point of time and some abnormality in the Morison's pouch was noted and requires follow-up.  No acute issues at this point of time to  discuss.  She states she is only here to follow-up on pending issues at discharge from [facility] 1.  Repeat LFTs to ensure they are resolving. 2.  Ultrasound abdomen to evaluate Morison's pouch which appeared to be abnormal during CT scan in July 2022 when she was admitted with bacteremia and cholangitis.  I explained to her that if the ultrasound is inconclusive she may need a CT scan of the abdomen   Follow up in 8 to 12 weeks  Dr August 2022 MD,MRCP(U.K)

## 2021-12-26 ENCOUNTER — Telehealth: Payer: Self-pay

## 2021-12-26 LAB — HEPATIC FUNCTION PANEL
ALT: 16 IU/L (ref 0–32)
AST: 24 IU/L (ref 0–40)
Albumin: 4.8 g/dL (ref 3.8–4.8)
Alkaline Phosphatase: 90 IU/L (ref 44–121)
Bilirubin Total: 0.5 mg/dL (ref 0.0–1.2)
Bilirubin, Direct: 0.12 mg/dL (ref 0.00–0.40)
Total Protein: 7.3 g/dL (ref 6.0–8.5)

## 2021-12-26 NOTE — Telephone Encounter (Signed)
-----   Message from Jonathon Bellows, MD sent at 12/26/2021 10:58 AM EST ----- Inform LFT's are back to normal

## 2021-12-26 NOTE — Telephone Encounter (Signed)
Called patient to let her know that her LFTs were normal and she had no further questions.

## 2021-12-30 ENCOUNTER — Ambulatory Visit
Admission: RE | Admit: 2021-12-30 | Discharge: 2021-12-30 | Disposition: A | Discharge status: Home / Self Care | Payer: BC Managed Care – PPO | Source: Ambulatory Visit | Attending: Gastroenterology | Admitting: Gastroenterology

## 2021-12-30 DIAGNOSIS — R9389 Abnormal findings on diagnostic imaging of other specified body structures: Secondary | ICD-10-CM | POA: Insufficient documentation

## 2021-12-31 ENCOUNTER — Telehealth: Payer: Self-pay

## 2021-12-31 ENCOUNTER — Encounter: Payer: Self-pay | Admitting: Gastroenterology

## 2021-12-31 NOTE — Telephone Encounter (Signed)
Called patient to let her know what her results were. Patient had no questions.

## 2021-12-31 NOTE — Progress Notes (Signed)
Inform no abnormality seen in morrisons pouch

## 2021-12-31 NOTE — Telephone Encounter (Signed)
-----   Message from Wyline Mood, MD sent at 12/31/2021  9:01 AM EST ----- Inform no abnormality seen in morrisons pouch

## 2022-02-26 ENCOUNTER — Encounter: Payer: Self-pay | Admitting: Gastroenterology

## 2022-02-26 ENCOUNTER — Ambulatory Visit (INDEPENDENT_AMBULATORY_CARE_PROVIDER_SITE_OTHER): Payer: BC Managed Care – PPO | Admitting: Gastroenterology

## 2022-02-26 ENCOUNTER — Other Ambulatory Visit: Payer: Self-pay

## 2022-02-26 VITALS — BP 143/84 | HR 69 | Temp 97.8°F | Wt 207.0 lb

## 2022-02-26 DIAGNOSIS — R7989 Other specified abnormal findings of blood chemistry: Secondary | ICD-10-CM

## 2022-02-26 DIAGNOSIS — R9389 Abnormal findings on diagnostic imaging of other specified body structures: Secondary | ICD-10-CM

## 2022-02-26 NOTE — Progress Notes (Signed)
?  ?Wyline Mood MD, MRCP(U.K) ?[address]  ?[address]  ?[place], [place] [address]  ?Main: [phone]  ?Fax: [phone] ? ? ?Primary Care Physician: Lorn Junes, FNP ? ?Primary Gastroenterologist:  Dr. Wyline Mood  ? ?Chief Complaint  ?Patient presents with  ? Bilirubin disorder  ? ? ?HPI: Kristine Strickland is a 66 y.o. female ? ?Summary of history : ? ?  ? She underwent an ERCP on 07/08/2021 at [facility].  The note stated that she had choledocholithiasis and had to have 2 large stones taken out from the common bile duct.  She presented to [facility] with sepsis and E. coli bacteremia in the setting of choledocholithiasis and acute cholangitis.  Treated with antibiotics and on 718 underwent sphincterotomy with removal of 2 large stones and intra ampullary biopsies.  Treat with antibiotics and discharged home.  CT abdomen on 07/08/2021 showed densities in the distal common bile duct with dilation of the extra and intrahepatic biliary ducts consistent with choledocholithiasis.  2 soft tissue deposits were seen in the region of the Morison's pouch and further follow-up was recommended. ?  ?07/10/2021 CMP demonstrated an elevated bilirubin of 1.6, alkaline phosphatase of 144, ALT 142 and AST of 50.  Hemoglobin of 10.4 g. ? ?07/08/2021 ampullary biopsy acute inflammation and reactive atypia no malignancy noted. ?  ?Interval history   12/25/2021-02/26/2022 ? ?12/30/2021: Ultrasound abdomen showed s/p cholecystectomy no abnormality visualized within the hepatorenal fossa i.e. Morison's pouch. ?12/25/2021: LFTs normal. ? ? ?Current Outpatient Medications  ?Medication Sig Dispense Refill  ? aspirin 81 MG EC tablet Take 1 tablet by mouth daily.    ? cetirizine (ZYRTEC) 10 MG tablet Take 1 tablet by mouth daily.    ? Cholecalciferol 50 MCG (2000 UT) CAPS Take 1 capsule by mouth daily.    ? docusate sodium (COLACE) 100 MG capsule Take 2 capsules by mouth daily.    ? esomeprazole (NEXIUM) 20 MG capsule Take 20 mg by mouth  daily.    ? GOODSENSE ARTHRITIS PAIN 1 % GEL SMARTSIG:2-4 Gram(s) Topical 1-4 Times Daily PRN    ? levothyroxine (SYNTHROID) 112 MCG tablet Take 1 tablet by mouth daily.    ? losartan (COZAAR) 50 MG tablet Take 50 mg by mouth daily.    ? Melatonin 10 MG TABS Take 1 tablet by mouth daily.    ? ?No current facility-administered medications for this visit.  ? ? ?Allergies as of 02/26/2022 - Review Complete 02/26/2022  ?Allergen Reaction Noted  ? Codeine Nausea And Vomiting 07/23/2015  ? Lovastatin Cough 12/24/2021  ? Sulfa antibiotics Nausea And Vomiting 07/23/2015  ? Penicillin g Rash 06/02/2015  ? Penicillins Rash 07/23/2015  ? ? ?ROS: ? ?General: Negative for anorexia, weight loss, fever, chills, fatigue, weakness. ?ENT: Negative for hoarseness, difficulty swallowing , nasal congestion. ?CV: Negative for chest pain, angina, palpitations, dyspnea on exertion, peripheral edema.  ?Respiratory: Negative for dyspnea at rest, dyspnea on exertion, cough, sputum, wheezing.  ?GI: See history of present illness. ?GU:  Negative for dysuria, hematuria, urinary incontinence, urinary frequency, nocturnal urination.  ?Endo: Negative for unusual weight change.  ?  ?Physical Examination: ? ? BP (!) 143/84   Pulse 69   Temp 97.8 ?F (36.6 ?C) (Oral)   Wt 207 lb (93.9 kg)   BMI 35.53 kg/m?  ? ?General: Well-nourished, well-developed in no acute distress.  ?Eyes: No icterus. Conjunctivae pink. ?Mouth: Oropharyngeal mucosa moist and pink , no lesions erythema or exudate. ?Lungs: Clear to  auscultation bilaterally. Non-labored. ?Heart: Regular rate and rhythm, no murmurs rubs or gallops.  ?Abdomen: Bowel sounds are normal, nontender, nondistended, no hepatosplenomegaly or masses, no abdominal bruits or hernia , no rebound or guarding.   ?Extremities: No lower extremity edema. No clubbing or deformities. ?Neuro: Alert and oriented x 3.  Grossly intact. ?Skin: Warm and dry, no jaundice.   ?Psych: Alert and cooperative, normal mood and  affect. ? ? ?Imaging Studies: ?No results found. ? ?Assessment and Plan:  ? ?Kristine Strickland is a 66 y.o. y/o female here to follow up for abnormal bilirubin levels.  It appears that in July 2022 she was admitted to [facility] with acute cholangitis secondary to choledocholithiasis and E. coli bacteremia.  She underwent an ERCP with sphincterotomy and stone extraction.  At that point of time and some abnormality in the Morison's pouch was noted , follow-up is suggested.  Since her last visit no abnormalities of LFTs noted which was rechecked and Morison's pouch  ultrasound showed no abnormalities either. ? ?  ?  ? ?Dr Wyline Mood  MD,MRCP [facility]) ?Follow up in as needed ?

## 2022-03-03 ENCOUNTER — Ambulatory Visit: Payer: BC Managed Care – PPO | Admitting: Internal Medicine

## 2022-03-14 ENCOUNTER — Ambulatory Visit: Payer: BC Managed Care – PPO | Admitting: Internal Medicine

## 2022-03-14 ENCOUNTER — Encounter: Payer: Self-pay | Admitting: Internal Medicine

## 2022-03-14 ENCOUNTER — Other Ambulatory Visit: Payer: Self-pay

## 2022-03-14 VITALS — BP 130/74 | HR 96 | Temp 97.9°F | Resp 16 | Ht 64.0 in | Wt 205.4 lb

## 2022-03-14 DIAGNOSIS — K2100 Gastro-esophageal reflux disease with esophagitis, without bleeding: Secondary | ICD-10-CM

## 2022-03-14 DIAGNOSIS — D508 Other iron deficiency anemias: Secondary | ICD-10-CM

## 2022-03-14 DIAGNOSIS — E039 Hypothyroidism, unspecified: Secondary | ICD-10-CM | POA: Diagnosis not present

## 2022-03-14 DIAGNOSIS — E7800 Pure hypercholesterolemia, unspecified: Secondary | ICD-10-CM

## 2022-03-14 DIAGNOSIS — G479 Sleep disorder, unspecified: Secondary | ICD-10-CM

## 2022-03-14 DIAGNOSIS — Z1211 Encounter for screening for malignant neoplasm of colon: Secondary | ICD-10-CM

## 2022-03-14 DIAGNOSIS — I10 Essential (primary) hypertension: Secondary | ICD-10-CM | POA: Diagnosis not present

## 2022-03-14 DIAGNOSIS — Z1231 Encounter for screening mammogram for malignant neoplasm of breast: Secondary | ICD-10-CM

## 2022-03-14 NOTE — Progress Notes (Signed)
Patient ID: Kristine Strickland, female   DOB: 12-04-1956, 66 y.o.   MRN: [id number] ? ? ?Subjective:  ? ? Patient ID: Kristine Strickland, female    DOB: 12/25/1955, 66 y.o.   MRN: [id number] ? ?This visit occurred during the SARS-CoV-2 public health emergency.  Safety protocols were in place, including screening questions prior to the visit, additional usage of staff PPE, and extensive cleaning of exam room while observing appropriate contact time as indicated for disinfecting solutions.  ? ?Patient here to establish care.  ? ?Chief Complaint  ?Patient presents with  ? Establish Care  ? .  ? ?HPI ?Previously followed at [facility] and has been seen at [facility].  Was admitted [facility] 06/2021 with sepsis and E. coli bacteremia in the setting of choledocholithiasis and acute cholangitis.  Treated with antibiotics and on 718 underwent sphincterotomy with removal of 2 large stones and intra ampullary biopsies.  Treated with antibiotics and discharged home.  CT abdomen on 07/08/2021 showed densities in the distal common bile duct with dilation of the extra and intrahepatic biliary ducts consistent with choledocholithiasis.  2 soft tissue deposits were seen in the region of the Morison's pouch. 07/08/2021 ampullary biopsy acute inflammation and reactive atypia no malignancy noted. Had f/u with Dr Tobi Bastos.  12/30/2021: Ultrasound abdomen showed s/p cholecystectomy no abnormality visualized within the hepatorenal fossa i.e. Morison's pouch. 12/25/2021: LFTs normal.  She is doing well. Eating.  No abdominal pain.  No nausea or vomiting.  No chest pain or sob. Bowels moving. Had colonoscopy 2019 - Dr Servando Snare.  Recommended f/u in 10 years.  Has a history of hypertension and hypercholesterolemia.  On losartan.  Reflux controlled on nexium.  Takes melatonin to help with sleep.  Previously saw gyn for post menopausal bleeding.  Evaluated.  States everything checked out fine.  No further bleeding.  Overall she feels she is doing well.   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    ? ?Past Medical History:  ?Diagnosis Date  ? Arthritis   ? right hip  ? Family history of adverse reaction to anesthesia   ? Mother - hard to wake  ? GERD (gastroesophageal reflux disease)   ? Hypertension   ? Hypothyroidism   ? Thyroid disease   ? hypothyroidism  ? ?Past Surgical History:  ?Procedure Laterality Date  ? BLADDER STEM    ? CHOLECYSTECTOMY    ? COLONOSCOPY    ? COLONOSCOPY WITH PROPOFOL N/A 07/16/2018  ? Procedure: COLONOSCOPY WITH PROPOFOL;  Surgeon: [name], MD;  Location: [facility];  Service: Endoscopy;  Laterality: N/A;  ? DILATION AND CURETTAGE OF UTERUS    ? ?Family History  ?Problem Relation Age of Onset  ? Miscarriages / [place] Mother   ? Kidney disease Mother   ? COPD Mother   ? Asthma Mother   ? Arthritis Mother   ? Hypertension Mother   ? Hyperlipidemia Mother   ? Epilepsy Mother   ? Heart attack Father   ? Hearing loss Father   ? COPD Father   ? Arthritis Father   ? Hearing loss Sister   ? Hypothyroidism Sister   ? Diverticulitis Sister   ? Hyperlipidemia Brother   ? Hearing loss Brother   ? Alcohol abuse Brother   ? Arthritis Brother   ? Hypertension Brother   ? Alcohol abuse Brother   ? Hyperlipidemia Brother   ? Hypertension Brother   ? Arthritis Brother   ? Breast cancer Maternal  Aunt 74  ? Cancer Maternal Grandmother   ? Arthritis Maternal Grandmother   ? Pancreatic cancer Maternal Grandmother   ? Hypertension Maternal Grandmother   ? Heart failure Maternal Grandfather   ? Heart attack Paternal Grandfather   ? Hypertension Paternal Grandfather   ? ?Social History  ? ?Socioeconomic History  ? Marital status: Married  ?  Spouse name: Not on file  ? Number of children: Not on file  ? Years of education: Not on file  ? Highest education level: Not on file  ?Occupational History  ? Not on file  ?Tobacco Use  ? Smoking status: Never  ? Smokeless tobacco: Never  ?Vaping Use  ? Vaping Use: Never used  ?Substance and Sexual  Activity  ? Alcohol use: No  ?  Alcohol/week: 0.0 standard drinks  ? Drug use: No  ? Sexual activity: Not on file  ?Other Topics Concern  ? Not on file  ?Social History Narrative  ? Not on file  ? ?Social Determinants of Health  ? ?Financial Resource Strain: Not on file  ?Food Insecurity: Not on file  ?Transportation Needs: Not on file  ?Physical Activity: Not on file  ?Stress: Not on file  ?Social Connections: Not on file  ? ? ? ?Review of Systems  ?Constitutional:  Negative for appetite change and unexpected weight change.  ?HENT:  Negative for congestion and sinus pressure.   ?Respiratory:  Negative for cough, chest tightness and shortness of breath.   ?Cardiovascular:  Negative for chest pain, palpitations and leg swelling.  ?Gastrointestinal:  Negative for abdominal pain, diarrhea, nausea and vomiting.  ?Genitourinary:  Negative for difficulty urinating and dysuria.  ?Musculoskeletal:  Negative for joint swelling and myalgias.  ?Skin:  Negative for color change and rash.  ?Neurological:  Negative for dizziness, light-headedness and headaches.  ?Psychiatric/Behavioral:  Negative for agitation and dysphoric mood.   ? ?   ?Objective:  ?  ? ?BP 130/74   Pulse 96   Temp 97.9 ?F (36.6 ?C)   Resp 16   Ht 5\' 4"  (1.626 m)   Wt 205 lb 6.4 oz (93.2 kg)   SpO2 98%   BMI 35.26 kg/m?  ?Wt Readings from Last 3 Encounters:  ?03/14/22 205 lb 6.4 oz (93.2 kg)  ?02/26/22 207 lb (93.9 kg)  ?12/25/21 209 lb (94.8 kg)  ? ? ?Physical Exam ?Vitals reviewed.  ?Constitutional:   ?   General: She is not in acute distress. ?   Appearance: Normal appearance.  ?HENT:  ?   Head: Normocephalic and atraumatic.  ?   Right Ear: External ear normal.  ?   Left Ear: External ear normal.  ?Eyes:  ?   General: No scleral icterus.    ?   Right eye: No discharge.     ?   Left eye: No discharge.  ?   Conjunctiva/sclera: Conjunctivae normal.  ?Neck:  ?   Thyroid: No thyromegaly.  ?Cardiovascular:  ?   Rate and Rhythm: Normal rate and regular  rhythm.  ?Pulmonary:  ?   Effort: No respiratory distress.  ?   Breath sounds: Normal breath sounds. No wheezing.  ?Abdominal:  ?   General: Bowel sounds are normal.  ?   Palpations: Abdomen is soft.  ?   Tenderness: There is no abdominal tenderness.  ?Musculoskeletal:     ?   General: No swelling or tenderness.  ?   Cervical back: Neck supple. No tenderness.  ?Lymphadenopathy:  ?   Cervical: No  cervical adenopathy.  ?Skin: ?   Findings: No erythema or rash.  ?Neurological:  ?   Mental Status: She is alert.  ?Psychiatric:     ?   Mood and Affect: Mood normal.     ?   Behavior: Behavior normal.  ? ? ? ?Outpatient Encounter Medications as of 03/14/2022  ?Medication Sig  ? aspirin 81 MG EC tablet Take 1 tablet by mouth daily.  ? cetirizine (ZYRTEC) 10 MG tablet Take 1 tablet by mouth daily.  ? Cholecalciferol 50 MCG (2000 UT) CAPS Take 1 capsule by mouth daily.  ? docusate sodium (COLACE) 100 MG capsule Take 2 capsules by mouth daily.  ? esomeprazole (NEXIUM) 20 MG capsule Take 20 mg by mouth daily.  ? GOODSENSE ARTHRITIS PAIN 1 % GEL SMARTSIG:2-4 Gram(s) Topical 1-4 Times Daily PRN  ? levothyroxine (SYNTHROID) 112 MCG tablet Take 1 tablet by mouth daily.  ? losartan (COZAAR) 50 MG tablet Take 50 mg by mouth daily.  ? Melatonin 10 MG TABS Take 1 tablet by mouth daily.  ? ?No facility-administered encounter medications on file as of 03/14/2022.  ?  ? ?Lab Results  ?Component Value Date  ? WBC 4.8 12/04/2017  ? HGB 12.6 12/04/2017  ? HCT 38.0 12/04/2017  ? PLT 336 12/04/2017  ? GLUCOSE 85 12/04/2017  ? CHOL 204 (H) 12/04/2017  ? TRIG 65 12/04/2017  ? HDL 60 12/04/2017  ? LDLCALC 128 (H) 12/04/2017  ? ALT 16 12/25/2021  ? AST 24 12/25/2021  ? NA 140 12/04/2017  ? K 3.9 12/04/2017  ? CL 102 12/04/2017  ? CREATININE 0.68 12/04/2017  ? BUN 23 12/04/2017  ? CO2 31 12/04/2017  ? TSH 0.59 12/04/2017  ? HGBA1C 5.6 12/04/2017  ? ? ?US Abdomen Complete ? ?Result Date: 12/30/2021 ?CLINICAL DATA:  Status post cholecystectomy in 2000.  History of common bile duct obstruction requiring hospitalization 06/2021. further evaluation of Morrison's pouch which appeared to be abnormal during CT scan in 06/2021 when patient was admitted with bacteremia. No out

## 2022-03-15 ENCOUNTER — Telehealth: Payer: Self-pay | Admitting: Internal Medicine

## 2022-03-15 ENCOUNTER — Encounter: Payer: Self-pay | Admitting: Internal Medicine

## 2022-03-15 DIAGNOSIS — G479 Sleep disorder, unspecified: Secondary | ICD-10-CM | POA: Insufficient documentation

## 2022-03-15 NOTE — Assessment & Plan Note (Signed)
Low cholesterol diet and exercise.  Follow lipid panel.   

## 2022-03-15 NOTE — Assessment & Plan Note (Signed)
Blood pressure as outlined.  Continues on losartan.  Follow pressures.  Have her spot check her pressure.  Get her back in soon to reassess. Check metabolic panel.  ?

## 2022-03-15 NOTE — Assessment & Plan Note (Signed)
Takes melatonin.  Works well for her.  Follow.  ?

## 2022-03-15 NOTE — Telephone Encounter (Signed)
Needs mammogram scheduled.  Prefers early am appt.  Also, need to clarify if had flu vaccine.  ?

## 2022-03-15 NOTE — Assessment & Plan Note (Signed)
On synthroid.  Follow tsh.   

## 2022-03-15 NOTE — Assessment & Plan Note (Signed)
Colonoscopy 2019.  Recommended f/u in 10 years.  

## 2022-03-15 NOTE — Assessment & Plan Note (Signed)
Has a documented history of anemia.  Check cbc with next labs.   ?

## 2022-03-15 NOTE — Assessment & Plan Note (Signed)
On nexium.  No upper symptoms reported.  Follow.  

## 2022-04-01 ENCOUNTER — Other Ambulatory Visit (INDEPENDENT_AMBULATORY_CARE_PROVIDER_SITE_OTHER): Payer: BC Managed Care – PPO

## 2022-04-01 DIAGNOSIS — I10 Essential (primary) hypertension: Secondary | ICD-10-CM

## 2022-04-01 DIAGNOSIS — E7800 Pure hypercholesterolemia, unspecified: Secondary | ICD-10-CM | POA: Diagnosis not present

## 2022-04-01 DIAGNOSIS — E039 Hypothyroidism, unspecified: Secondary | ICD-10-CM

## 2022-04-01 LAB — CBC WITH DIFFERENTIAL/PLATELET
Basophils Absolute: 0.1 10*3/uL (ref 0.0–0.1)
Basophils Relative: 1 % (ref 0.0–3.0)
Eosinophils Absolute: 0.2 10*3/uL (ref 0.0–0.7)
Eosinophils Relative: 3.3 % (ref 0.0–5.0)
HCT: 37.3 % (ref 36.0–46.0)
Hemoglobin: 12.5 g/dL (ref 12.0–15.0)
Lymphocytes Relative: 24.4 % (ref 12.0–46.0)
Lymphs Abs: 1.3 10*3/uL (ref 0.7–4.0)
MCHC: 33.5 g/dL (ref 30.0–36.0)
MCV: 82.3 fl (ref 78.0–100.0)
Monocytes Absolute: 0.4 10*3/uL (ref 0.1–1.0)
Monocytes Relative: 7.4 % (ref 3.0–12.0)
Neutro Abs: 3.5 10*3/uL (ref 1.4–7.7)
Neutrophils Relative %: 63.9 % (ref 43.0–77.0)
Platelets: 286 10*3/uL (ref 150.0–400.0)
RBC: 4.54 Mil/uL (ref 3.87–5.11)
RDW: 14.2 % (ref 11.5–15.5)
WBC: 5.5 10*3/uL (ref 4.0–10.5)

## 2022-04-01 LAB — LIPID PANEL
Cholesterol: 234 mg/dL — ABNORMAL HIGH (ref 0–200)
HDL: 58 mg/dL (ref >39.00)
LDL Cholesterol: 148 mg/dL — ABNORMAL HIGH (ref 0–99)
NonHDL: 176.42
Total CHOL/HDL Ratio: 4
Triglycerides: 143 mg/dL (ref 0.0–149.0)
VLDL: 28.6 mg/dL (ref 0.0–40.0)

## 2022-04-01 LAB — HEPATIC FUNCTION PANEL
ALT: 11 U/L (ref 0–35)
AST: 16 U/L (ref 0–37)
Albumin: 4.2 g/dL (ref 3.5–5.2)
Alkaline Phosphatase: 67 U/L (ref 39–117)
Bilirubin, Direct: 0.1 mg/dL (ref 0.0–0.3)
Total Bilirubin: 0.5 mg/dL (ref 0.2–1.2)
Total Protein: 6.5 g/dL (ref 6.0–8.3)

## 2022-04-01 LAB — BASIC METABOLIC PANEL
BUN: 11 mg/dL (ref 6–23)
CO2: 29 mEq/L (ref 19–32)
Calcium: 9.7 mg/dL (ref 8.4–10.5)
Chloride: 104 mEq/L (ref 96–112)
Creatinine, Ser: 0.73 mg/dL (ref 0.40–1.20)
GFR: 87.45 mL/min (ref >60.00)
Glucose, Bld: 91 mg/dL (ref 70–99)
Potassium: 4.3 mEq/L (ref 3.5–5.1)
Sodium: 140 mEq/L (ref 135–145)

## 2022-04-01 LAB — TSH: TSH: 3.22 u[IU]/mL (ref 0.35–5.50)

## 2022-04-22 ENCOUNTER — Ambulatory Visit
Admission: RE | Admit: 2022-04-22 | Discharge: 2022-04-22 | Disposition: A | Discharge status: Home / Self Care | Payer: BC Managed Care – PPO | Source: Ambulatory Visit | Attending: Internal Medicine | Admitting: Internal Medicine

## 2022-04-22 DIAGNOSIS — Z1231 Encounter for screening mammogram for malignant neoplasm of breast: Secondary | ICD-10-CM | POA: Insufficient documentation

## 2022-04-23 ENCOUNTER — Encounter: Payer: Self-pay | Admitting: Internal Medicine

## 2022-04-23 DIAGNOSIS — Z0000 Encounter for general adult medical examination without abnormal findings: Secondary | ICD-10-CM | POA: Insufficient documentation

## 2022-05-14 ENCOUNTER — Other Ambulatory Visit (HOSPITAL_COMMUNITY)
Admission: RE | Admit: 2022-05-14 | Discharge: 2022-05-14 | Disposition: A | Discharge status: Home / Self Care | Payer: BC Managed Care – PPO | Source: Ambulatory Visit | Attending: Internal Medicine | Admitting: Internal Medicine

## 2022-05-14 ENCOUNTER — Encounter: Payer: Self-pay | Admitting: Internal Medicine

## 2022-05-14 ENCOUNTER — Ambulatory Visit (INDEPENDENT_AMBULATORY_CARE_PROVIDER_SITE_OTHER): Payer: BC Managed Care – PPO | Admitting: Internal Medicine

## 2022-05-14 VITALS — BP 126/76 | HR 75 | Temp 98.0°F | Resp 15 | Ht 64.0 in | Wt 208.8 lb

## 2022-05-14 DIAGNOSIS — Z0000 Encounter for general adult medical examination without abnormal findings: Secondary | ICD-10-CM

## 2022-05-14 DIAGNOSIS — Z124 Encounter for screening for malignant neoplasm of cervix: Secondary | ICD-10-CM | POA: Diagnosis not present

## 2022-05-14 DIAGNOSIS — Z1211 Encounter for screening for malignant neoplasm of colon: Secondary | ICD-10-CM

## 2022-05-14 DIAGNOSIS — I10 Essential (primary) hypertension: Secondary | ICD-10-CM | POA: Diagnosis not present

## 2022-05-14 DIAGNOSIS — E039 Hypothyroidism, unspecified: Secondary | ICD-10-CM

## 2022-05-14 DIAGNOSIS — E7800 Pure hypercholesterolemia, unspecified: Secondary | ICD-10-CM

## 2022-05-14 MED ORDER — LEVOTHYROXINE SODIUM 112 MCG PO TABS: 112.0000 ug | ORAL_TABLET | Freq: Every day | ORAL | 3 refills | Status: DC

## 2022-05-14 MED ORDER — LOSARTAN POTASSIUM 100 MG PO TABS: 100.0000 mg | ORAL_TABLET | Freq: Every day | ORAL | 1 refills | Status: DC

## 2022-05-14 NOTE — Progress Notes (Signed)
Patient ID: LAVINA RESOR, female   DOB: Jun 23, 1956, 66 y.o.   MRN: [id number]   Subjective:    Patient ID: ALLECIA BELLS, female    DOB: 1956-11-13, 66 y.o.   MRN: [id number]   Patient here for her physical exam.   Chief Complaint  Patient presents with   Annual Exam    CPE   .   HPI She reports she is doing relatively well.  Tries to stay active. No chest pain or sob reported.  No abdominal pain or bowel change reported.  Brought in outside blood pressure readings.  Reviewed - 124-154/80-90.    Past Medical History:  Diagnosis Date   Arthritis    right hip   Family history of adverse reaction to anesthesia    Mother - hard to wake   GERD (gastroesophageal reflux disease)    Hypertension    Hypothyroidism    Thyroid disease    hypothyroidism   Past Surgical History:  Procedure Laterality Date   BLADDER STEM     CHOLECYSTECTOMY     COLONOSCOPY     COLONOSCOPY WITH PROPOFOL N/A 07/16/2018   Procedure: COLONOSCOPY WITH PROPOFOL;  Surgeon: [name], MD;  Location: [facility];  Service: Endoscopy;  Laterality: N/A;   DILATION AND CURETTAGE OF UTERUS     Family History  Problem Relation Age of Onset   Miscarriages / Stillbirths Mother    Kidney disease Mother    COPD Mother    Asthma Mother    Arthritis Mother    Hypertension Mother    Hyperlipidemia Mother    Epilepsy Mother    Heart attack Father    Hearing loss Father    COPD Father    Arthritis Father    Hearing loss Sister    Hypothyroidism Sister    Diverticulitis Sister    Hyperlipidemia Brother    Hearing loss Brother    Alcohol abuse Brother    Arthritis Brother    Hypertension Brother    Alcohol abuse Brother    Hyperlipidemia Brother    Hypertension Brother    Arthritis Brother    Breast cancer Maternal Aunt 74   Cancer Maternal Grandmother    Arthritis Maternal Grandmother    Pancreatic cancer Maternal Grandmother    Hypertension Maternal Grandmother    Heart failure  Maternal Grandfather    Heart attack Paternal Grandfather    Hypertension Paternal Grandfather    Social History   Socioeconomic History   Marital status: Married    Spouse name: Not on file   Number of children: Not on file   Years of education: Not on file   Highest education level: Not on file  Occupational History   Not on file  Tobacco Use   Smoking status: Never   Smokeless tobacco: Never  Vaping Use   Vaping Use: Never used  Substance and Sexual Activity   Alcohol use: No    Alcohol/week: 0.0 standard drinks   Drug use: No   Sexual activity: Not on file  Other Topics Concern   Not on file  Social History Narrative   Not on file   Social Determinants of Health   Financial Resource Strain: Not on file  Food Insecurity: Not on file  Transportation Needs: Not on file  Physical Activity: Not on file  Stress: Not on file  Social Connections: Not on file     Review of Systems  Constitutional:  Negative for appetite change and unexpected weight  change.  HENT:  Negative for congestion, sinus pressure and sore throat.   Eyes:  Negative for pain and visual disturbance.  Respiratory:  Negative for cough, chest tightness and shortness of breath.   Cardiovascular:  Negative for chest pain, palpitations and leg swelling.  Gastrointestinal:  Negative for abdominal pain, diarrhea, nausea and vomiting.  Genitourinary:  Negative for difficulty urinating and dysuria.  Musculoskeletal:  Negative for joint swelling and myalgias.  Skin:  Negative for color change and rash.  Neurological:  Negative for dizziness, light-headedness and headaches.  Hematological:  Negative for adenopathy. Does not bruise/bleed easily.  Psychiatric/Behavioral:  Negative for agitation and dysphoric mood.       Objective:     BP 126/76 (BP Location: Left Arm, Patient Position: Sitting, Cuff Size: Large)   Pulse 75   Temp 98 F (36.7 C) (Temporal)   Resp 15   Ht  (1.626 m)   Wt 208 lb  12.8 oz (94.7 kg)   SpO2 98%   BMI 35.84 kg/m  Wt Readings from Last 3 Encounters:  05/14/22 208 lb 12.8 oz (94.7 kg)  03/14/22 205 lb 6.4 oz (93.2 kg)  02/26/22 207 lb (93.9 kg)    Physical Exam Vitals reviewed.  Constitutional:      General: She is not in acute distress.    Appearance: Normal appearance. She is well-developed.  HENT:     Head: Normocephalic and atraumatic.     Right Ear: External ear normal.     Left Ear: External ear normal.  Eyes:     General: No scleral icterus.       Right eye: No discharge.        Left eye: No discharge.     Conjunctiva/sclera: Conjunctivae normal.  Neck:     Thyroid: No thyromegaly.  Cardiovascular:     Rate and Rhythm: Normal rate and regular rhythm.  Pulmonary:     Effort: No tachypnea, accessory muscle usage or respiratory distress.     Breath sounds: Normal breath sounds. No decreased breath sounds or wheezing.  Chest:  Breasts:    Right: No inverted nipple, mass, nipple discharge or tenderness (no axillary adenopathy).     Left: No inverted nipple, mass, nipple discharge or tenderness (no axilarry adenopathy).  Abdominal:     General: Bowel sounds are normal.     Palpations: Abdomen is soft.     Tenderness: There is no abdominal tenderness.  Genitourinary:    Comments: Normal external genitalia.  Vaginal vault without lesions.  Cervix identified.  Pap smear performed.  Could not appreciate any adnexal masses or tenderness.   Musculoskeletal:        General: No swelling or tenderness.     Cervical back: Neck supple.  Lymphadenopathy:     Cervical: No cervical adenopathy.  Skin:    Findings: No erythema or rash.  Neurological:     Mental Status: She is alert and oriented to person, place, and time.  Psychiatric:        Mood and Affect: Mood normal.        Behavior: Behavior normal.     Outpatient Encounter Medications as of 05/14/2022  Medication Sig   aspirin 81 MG EC tablet Take 1 tablet by mouth daily.    cetirizine (ZYRTEC) 10 MG tablet Take 1 tablet by mouth daily.   Cholecalciferol 50 MCG (2000 UT) CAPS Take 1 capsule by mouth daily.   docusate sodium (COLACE) 100 MG capsule Take 2 capsules by  mouth daily.   esomeprazole (NEXIUM) 20 MG capsule Take 20 mg by mouth daily.   GOODSENSE ARTHRITIS PAIN 1 % GEL SMARTSIG:2-4 Gram(s) Topical 1-4 Times Daily PRN   losartan (COZAAR) 100 MG tablet Take 1 tablet (100 mg total) by mouth daily.   Melatonin 10 MG TABS Take 1 tablet by mouth daily.   [DISCONTINUED] levothyroxine (SYNTHROID) 112 MCG tablet Take 1 tablet by mouth daily.   [DISCONTINUED] losartan (COZAAR) 50 MG tablet Take 50 mg by mouth daily.   levothyroxine (SYNTHROID) 112 MCG tablet Take 1 tablet (112 mcg total) by mouth daily.   No facility-administered encounter medications on file as of 05/14/2022.     Lab Results  Component Value Date   WBC 5.5 04/01/2022   HGB 12.5 04/01/2022   HCT 37.3 04/01/2022   PLT 286.0 04/01/2022   GLUCOSE 91 04/01/2022   CHOL 234 (H) 04/01/2022   TRIG 143.0 04/01/2022   HDL 58.00 04/01/2022   LDLCALC 148 (H) 04/01/2022   ALT 11 04/01/2022   AST 16 04/01/2022   NA 140 04/01/2022   K 4.3 04/01/2022   CL 104 04/01/2022   CREATININE 0.73 04/01/2022   BUN 11 04/01/2022   CO2 29 04/01/2022   TSH 3.22 04/01/2022   HGBA1C 5.6 12/04/2017    MM 3D SCREEN BREAST BILATERAL  Result Date: 04/22/2022 CLINICAL DATA:  Screening. EXAM: DIGITAL SCREENING BILATERAL MAMMOGRAM WITH TOMOSYNTHESIS AND CAD TECHNIQUE: Bilateral screening digital craniocaudal and mediolateral oblique mammograms were obtained. Bilateral screening digital breast tomosynthesis was performed. COMPARISON:  Previous exams. ACR Breast Density Category b: There are scattered areas of fibroglandular density. FINDINGS: There are no findings suspicious for malignancy. IMPRESSION: No mammographic evidence of malignancy. A result letter of this screening mammogram will be mailed directly to the patient.  RECOMMENDATION: Screening mammogram in one year. (Code:SM-B-01Y) BI-RADS CATEGORY  1: Negative. Electronically Signed   By: Sherron Ales M.D.   On: 04/22/2022 12:17       Assessment & Plan:   Problem List Items Addressed This Visit     Adult hypothyroidism    On synthroid.  Follow tsh.        Relevant Medications   levothyroxine (SYNTHROID) 112 MCG tablet   Colon cancer screening    Colonoscopy 2019.  Recommended f/u in 10 years.        Essential hypertension    Blood pressure as outlined. Elevated above goal.  On losartan 50mg  q day.  Increase to 100mg  q day.  Follow pressures.  Follow metabolic panel.        Relevant Medications   losartan (COZAAR) 100 MG tablet   Healthcare maintenance    Physical today 05/14/22.  PAP 05/14/22.  Colonoscopy 2019.  Recommended f/u in 10 years.        Hypercholesterolemia without hypertriglyceridemia    The 10-year ASCVD risk score (Arnett DK, et al., 2019) is: 6.3%   Values used to calculate the score:     Age: 53 years     Sex: Female     Is Non-Hispanic African American: No     Diabetic: No     Tobacco smoker: No     Systolic Blood Pressure: 126 mmHg     Is BP treated: Yes     HDL Cholesterol: 58 mg/dL     Total Cholesterol: 234 mg/dL  Low cholesterol diet and exercise.  Follow lipid panel.        Relevant Medications   losartan (COZAAR) 100 MG tablet  Other Visit Diagnoses     Routine general medical examination at a health care facility    -  Primary   Screening for cervical cancer       Relevant Orders   Cytology - PAP( [facility]) (Completed)        Dale Durhamharlene Halim Surrette, MD

## 2022-05-14 NOTE — Patient Instructions (Signed)
Increase losartan to 100mg per day 

## 2022-05-15 LAB — CYTOLOGY - PAP
Comment: NEGATIVE
Diagnosis: NEGATIVE
High risk HPV: NEGATIVE

## 2022-05-20 ENCOUNTER — Encounter: Payer: Self-pay | Admitting: Internal Medicine

## 2022-05-20 NOTE — Assessment & Plan Note (Signed)
Colonoscopy 2019.  Recommended f/u in 10 years.  

## 2022-05-20 NOTE — Assessment & Plan Note (Signed)
The 10-year ASCVD risk score (Arnett DK, et al., 2019) is: 6.3%   Values used to calculate the score:     Age: 66 years     Sex: Female     Is Non-Hispanic African American: No     Diabetic: No     Tobacco smoker: No     Systolic Blood Pressure: 126 mmHg     Is BP treated: Yes     HDL Cholesterol: 58 mg/dL     Total Cholesterol: 234 mg/dL  Low cholesterol diet and exercise.  Follow lipid panel.

## 2022-05-20 NOTE — Assessment & Plan Note (Signed)
Blood pressure as outlined. Elevated above goal.  On losartan 50mg  q day.  Increase to 100mg  q day.  Follow pressures.  Follow metabolic panel.

## 2022-05-20 NOTE — Assessment & Plan Note (Signed)
Physical today 05/14/22.  PAP 05/14/22.  Colonoscopy 2019.  Recommended f/u in 10 years.

## 2022-05-20 NOTE — Assessment & Plan Note (Signed)
On synthroid.  Follow tsh.   

## 2022-07-09 ENCOUNTER — Encounter: Payer: Self-pay | Admitting: Internal Medicine

## 2022-07-09 ENCOUNTER — Ambulatory Visit: Payer: BC Managed Care – PPO | Admitting: Internal Medicine

## 2022-07-09 DIAGNOSIS — I10 Essential (primary) hypertension: Secondary | ICD-10-CM | POA: Diagnosis not present

## 2022-07-09 DIAGNOSIS — K2100 Gastro-esophageal reflux disease with esophagitis, without bleeding: Secondary | ICD-10-CM

## 2022-07-09 DIAGNOSIS — D508 Other iron deficiency anemias: Secondary | ICD-10-CM | POA: Diagnosis not present

## 2022-07-09 DIAGNOSIS — E039 Hypothyroidism, unspecified: Secondary | ICD-10-CM

## 2022-07-09 DIAGNOSIS — Z1211 Encounter for screening for malignant neoplasm of colon: Secondary | ICD-10-CM

## 2022-07-09 DIAGNOSIS — J069 Acute upper respiratory infection, unspecified: Secondary | ICD-10-CM

## 2022-07-09 DIAGNOSIS — E7800 Pure hypercholesterolemia, unspecified: Secondary | ICD-10-CM

## 2022-07-09 MED ORDER — PREDNISONE 10 MG PO TABS: ORAL_TABLET | ORAL | 0 refills | Status: DC

## 2022-07-09 NOTE — Progress Notes (Signed)
Patient ID: Kristine Strickland, female   DOB: 1956/06/18, 66 y.o.   MRN: [id number]   Subjective:    Patient ID: Kristine Strickland, female    DOB: [birth year], 66 y.o.   MRN: [id number]   Patient here for a scheduled follow up.   Chief Complaint  Patient presents with   Hypertension   .   HPI Losartan was increased to 100mg  q day last visit.  Blood pressure is doing better.  Most readings - 120-130/70s.  Tolerating the medication.  No chest pain or sob.  She does report that starting Friday 07/04/22 - developed sore throat.  Next day - progressed to sinus congestion and cough. Reports nasal congestion. Clear mucus.  No chest pain or sob reported.  Increased cough - coughing fits. No nausea or vomiting.  No bowel change.    Past Medical History:  Diagnosis Date   Arthritis    right hip   Family history of adverse reaction to anesthesia    Mother - hard to wake   GERD (gastroesophageal reflux disease)    Hypertension    Hypothyroidism    Thyroid disease    hypothyroidism   Past Surgical History:  Procedure Laterality Date   BLADDER STEM     CHOLECYSTECTOMY     COLONOSCOPY     COLONOSCOPY WITH PROPOFOL N/A 07/16/2018   Procedure: COLONOSCOPY WITH PROPOFOL;  Surgeon: 07/18/2018, MD;  Location: [facility];  Service: Endoscopy;  Laterality: N/A;   DILATION AND CURETTAGE OF UTERUS     Family History  Problem Relation Age of Onset   Miscarriages / Stillbirths Mother    Kidney disease Mother    COPD Mother    Asthma Mother    Arthritis Mother    Hypertension Mother    Hyperlipidemia Mother    Epilepsy Mother    Heart attack Father    Hearing loss Father    COPD Father    Arthritis Father    Hearing loss Sister    Hypothyroidism Sister    Diverticulitis Sister    Hyperlipidemia Brother    Hearing loss Brother    Alcohol abuse Brother    Arthritis Brother    Hypertension Brother    Alcohol abuse Brother    Hyperlipidemia Brother    Hypertension Brother     Arthritis Brother    Breast cancer Maternal Aunt 74   Cancer Maternal Grandmother    Arthritis Maternal Grandmother    Pancreatic cancer Maternal Grandmother    Hypertension Maternal Grandmother    Heart failure Maternal Grandfather    Heart attack Paternal Grandfather    Hypertension Paternal Grandfather    Social History   Socioeconomic History   Marital status: Married    Spouse name: Not on file   Number of children: Not on file   Years of education: Not on file   Highest education level: Not on file  Occupational History   Not on file  Tobacco Use   Smoking status: Never   Smokeless tobacco: Never  Vaping Use   Vaping Use: Never used  Substance and Sexual Activity   Alcohol use: No    Alcohol/week: 0.0 standard drinks of alcohol   Drug use: No   Sexual activity: Not on file  Other Topics Concern   Not on file  Social History Narrative   Not on file   Social Determinants of Health   Financial Resource Strain: Not on file  Food Insecurity: Not on file  Transportation Needs: Not on file  Physical Activity: Not on file  Stress: Not on file  Social Connections: Not on file     Review of Systems  Constitutional:  Negative for appetite change, fever and unexpected weight change.  HENT:  Positive for congestion, postnasal drip and sinus pressure.   Respiratory:  Positive for cough. Negative for chest tightness and shortness of breath.   Cardiovascular:  Negative for chest pain, palpitations and leg swelling.  Gastrointestinal:  Negative for abdominal pain, diarrhea, nausea and vomiting.  Genitourinary:  Negative for difficulty urinating and dysuria.  Musculoskeletal:  Negative for joint swelling and myalgias.  Skin:  Negative for color change and rash.  Neurological:  Negative for dizziness, light-headedness and headaches.  Psychiatric/Behavioral:  Negative for agitation and dysphoric mood.        Objective:     BP 122/78 (BP Location: Left Arm, Patient  Position: Sitting, Cuff Size: Large)   Pulse 81   Temp 97.9 F (36.6 C) (Temporal)   Resp 16   Ht 5\' 4"  (1.626 m)   Wt 211 lb 3.2 oz (95.8 kg)   SpO2 96%   BMI 36.25 kg/m  Wt Readings from Last 3 Encounters:  07/09/22 211 lb 3.2 oz (95.8 kg)  05/14/22 208 lb 12.8 oz (94.7 kg)  03/14/22 205 lb 6.4 oz (93.2 kg)    Physical Exam Vitals reviewed.  Constitutional:      General: She is not in acute distress.    Appearance: Normal appearance.  HENT:     Head: Normocephalic and atraumatic.     Right Ear: External ear normal.     Left Ear: External ear normal.     Mouth/Throat:     Pharynx: No oropharyngeal exudate or posterior oropharyngeal erythema.  Eyes:     General: No scleral icterus.       Right eye: No discharge.        Left eye: No discharge.     Conjunctiva/sclera: Conjunctivae normal.  Neck:     Thyroid: No thyromegaly.  Cardiovascular:     Rate and Rhythm: Normal rate and regular rhythm.  Pulmonary:     Effort: No respiratory distress.     Breath sounds: Normal breath sounds. No wheezing.  Abdominal:     General: Bowel sounds are normal.     Palpations: Abdomen is soft.     Tenderness: There is no abdominal tenderness.  Musculoskeletal:        General: No swelling or tenderness.     Cervical back: Neck supple. No tenderness.  Lymphadenopathy:     Cervical: No cervical adenopathy.  Skin:    Findings: No erythema or rash.  Neurological:     Mental Status: She is alert.  Psychiatric:        Mood and Affect: Mood normal.        Behavior: Behavior normal.      Outpatient Encounter Medications as of 07/09/2022  Medication Sig   aspirin 81 MG EC tablet Take 1 tablet by mouth daily.   cetirizine (ZYRTEC) 10 MG tablet Take 1 tablet by mouth daily.   Cholecalciferol 50 MCG (2000 UT) CAPS Take 1 capsule by mouth daily.   docusate sodium (COLACE) 100 MG capsule Take 2 capsules by mouth daily.   esomeprazole (NEXIUM) 20 MG capsule Take 20 mg by mouth daily.    GOODSENSE ARTHRITIS PAIN 1 % GEL SMARTSIG:2-4 Gram(s) Topical 1-4 Times Daily PRN   levothyroxine (SYNTHROID) 112 MCG tablet Take 1  tablet (112 mcg total) by mouth daily.   losartan (COZAAR) 100 MG tablet Take 1 tablet (100 mg total) by mouth daily.   Melatonin 10 MG TABS Take 1 tablet by mouth daily.   predniSONE (DELTASONE) 10 MG tablet Take 4 tablets x 1 day and then decrease by 1/2 tablet per day until down to zero mg.   No facility-administered encounter medications on file as of 07/09/2022.     Lab Results  Component Value Date   WBC 5.5 04/01/2022   HGB 12.5 04/01/2022   HCT 37.3 04/01/2022   PLT 286.0 04/01/2022   GLUCOSE 91 04/01/2022   CHOL 234 (H) 04/01/2022   TRIG 143.0 04/01/2022   HDL 58.00 04/01/2022   LDLCALC 148 (H) 04/01/2022   ALT 11 04/01/2022   AST 16 04/01/2022   NA 140 04/01/2022   K 4.3 04/01/2022   CL 104 04/01/2022   CREATININE 0.73 04/01/2022   BUN 11 04/01/2022   CO2 29 04/01/2022   TSH 3.22 04/01/2022   HGBA1C 5.6 12/04/2017       Assessment & Plan:   Problem List Items Addressed This Visit     Absolute anemia    Has a documented history of anemia.  Follow cbc.       Adult hypothyroidism    On synthroid.  Follow tsh.       Relevant Orders   TSH   Colon cancer screening    Colonoscopy 2019.  Recommended f/u in 10 years.       Esophagitis, reflux    On nexium.  No upper symptoms reported.  Follow.       Essential hypertension    Continue losartan 100mg  q day .  Blood pressure improved.  Continue to follow pressures.  Follow metabolic panel.       Relevant Orders   Basic metabolic panel   Hypercholesterolemia without hypertriglyceridemia    The 10-year ASCVD risk score (Arnett DK, et al., 2019) is: 5.9%   Values used to calculate the score:     Age: 80 years     Sex: Female     Is Non-Hispanic African American: No     Diabetic: No     Tobacco smoker: No     Systolic Blood Pressure: 123XX123 mmHg     Is BP treated: Yes     HDL  Cholesterol: 58 mg/dL     Total Cholesterol: 234 mg/dL  Low cholesterol diet and exercise.  Follow lipid panel.       Relevant Orders   Hepatic function panel   Lipid panel   URI (upper respiratory infection)    Some sinus congestion with increased cough as outlined.  Coughing fits.  Saline nasal spray and steroid nasal spray as directed.  Prednisone taper as directed.  Follow.  Call with update.  Hold abx.          Einar Pheasant, MD

## 2022-07-09 NOTE — Patient Instructions (Signed)
Nasacort nasal spray - 2 sprays each nostril one time per day.  Do this in the evening.   Saline nasal spray - flush nose 1-2x/day

## 2022-07-14 ENCOUNTER — Encounter: Payer: Self-pay | Admitting: Internal Medicine

## 2022-07-14 DIAGNOSIS — J069 Acute upper respiratory infection, unspecified: Secondary | ICD-10-CM | POA: Insufficient documentation

## 2022-07-14 NOTE — Assessment & Plan Note (Signed)
Some sinus congestion with increased cough as outlined.  Coughing fits.  Saline nasal spray and steroid nasal spray as directed.  Prednisone taper as directed.  Follow.  Call with update.  Hold abx.

## 2022-07-14 NOTE — Assessment & Plan Note (Signed)
The 10-year ASCVD risk score (Arnett DK, et al., 2019) is: 5.9%   Values used to calculate the score:     Age: 66 years     Sex: Female     Is Non-Hispanic African American: No     Diabetic: No     Tobacco smoker: No     Systolic Blood Pressure: 122 mmHg     Is BP treated: Yes     HDL Cholesterol: 58 mg/dL     Total Cholesterol: 234 mg/dL  Low cholesterol diet and exercise.  Follow lipid panel.

## 2022-07-14 NOTE — Assessment & Plan Note (Signed)
Colonoscopy 2019.  Recommended f/u in 10 years.  

## 2022-07-14 NOTE — Assessment & Plan Note (Signed)
Continue losartan 100mg q day .  Blood pressure improved.  Continue to follow pressures.  Follow metabolic panel.  

## 2022-07-14 NOTE — Assessment & Plan Note (Signed)
Has a documented history of anemia.  Follow cbc.   

## 2022-07-14 NOTE — Assessment & Plan Note (Signed)
On synthroid.  Follow tsh.   

## 2022-07-14 NOTE — Assessment & Plan Note (Signed)
On nexium.  No upper symptoms reported.  Follow.  

## 2022-09-05 ENCOUNTER — Other Ambulatory Visit (INDEPENDENT_AMBULATORY_CARE_PROVIDER_SITE_OTHER): Payer: BC Managed Care – PPO

## 2022-09-05 DIAGNOSIS — I10 Essential (primary) hypertension: Secondary | ICD-10-CM

## 2022-09-05 DIAGNOSIS — E7800 Pure hypercholesterolemia, unspecified: Secondary | ICD-10-CM | POA: Diagnosis not present

## 2022-09-05 DIAGNOSIS — E039 Hypothyroidism, unspecified: Secondary | ICD-10-CM | POA: Diagnosis not present

## 2022-09-05 LAB — BASIC METABOLIC PANEL
BUN: 16 mg/dL (ref 6–23)
CO2: 28 mEq/L (ref 19–32)
Calcium: 9.5 mg/dL (ref 8.4–10.5)
Chloride: 103 mEq/L (ref 96–112)
Creatinine, Ser: 0.79 mg/dL (ref 0.40–1.20)
GFR: 79.3 mL/min (ref >60.00)
Glucose, Bld: 87 mg/dL (ref 70–99)
Potassium: 4.3 mEq/L (ref 3.5–5.1)
Sodium: 139 mEq/L (ref 135–145)

## 2022-09-05 LAB — HEPATIC FUNCTION PANEL
ALT: 11 U/L (ref 0–35)
AST: 15 U/L (ref 0–37)
Albumin: 4.1 g/dL (ref 3.5–5.2)
Alkaline Phosphatase: 76 U/L (ref 39–117)
Bilirubin, Direct: 0.1 mg/dL (ref 0.0–0.3)
Total Bilirubin: 0.6 mg/dL (ref 0.2–1.2)
Total Protein: 6.7 g/dL (ref 6.0–8.3)

## 2022-09-05 LAB — TSH: TSH: 3.96 u[IU]/mL (ref 0.35–5.50)

## 2022-09-05 LAB — LIPID PANEL
Cholesterol: 206 mg/dL — ABNORMAL HIGH (ref 0–200)
HDL: 54.3 mg/dL (ref >39.00)
LDL Cholesterol: 129 mg/dL — ABNORMAL HIGH (ref 0–99)
NonHDL: 151.62
Total CHOL/HDL Ratio: 4
Triglycerides: 114 mg/dL (ref 0.0–149.0)
VLDL: 22.8 mg/dL (ref 0.0–40.0)

## 2022-09-10 ENCOUNTER — Ambulatory Visit: Payer: BC Managed Care – PPO | Admitting: Internal Medicine

## 2022-09-10 ENCOUNTER — Encounter: Payer: Self-pay | Admitting: Internal Medicine

## 2022-09-10 VITALS — BP 132/88 | HR 73 | Temp 97.8°F | Ht 64.0 in | Wt 217.6 lb

## 2022-09-10 DIAGNOSIS — Z23 Encounter for immunization: Secondary | ICD-10-CM | POA: Diagnosis not present

## 2022-09-10 DIAGNOSIS — I10 Essential (primary) hypertension: Secondary | ICD-10-CM

## 2022-09-10 DIAGNOSIS — E039 Hypothyroidism, unspecified: Secondary | ICD-10-CM

## 2022-09-10 DIAGNOSIS — E7800 Pure hypercholesterolemia, unspecified: Secondary | ICD-10-CM

## 2022-09-10 NOTE — Assessment & Plan Note (Addendum)
The 10-year ASCVD risk score (Arnett DK, et al., 2019) is: 5.6%   Values used to calculate the score:     Age: 66 years     Sex: Female     Is Non-Hispanic African American: No     Diabetic: No     Tobacco smoker: No     Systolic Blood Pressure: 161 mmHg     Is BP treated: Yes     HDL Cholesterol: 54.3 mg/dL     Total Cholesterol: 206 mg/dL  Low cholesterol diet and exercise.  Follow lipid panel. Discussed.  Information - [facility] Lipid diet given.

## 2022-09-10 NOTE — Progress Notes (Signed)
Subjective:    Patient ID: Kristine Strickland, female    DOB: 1956-10-01, 66 y.o.   MRN: [id number]   Patient here for follow up appt  .   HPI Here to follow up regarding her blood pressure.  On losartan.  Doing well.  Has started walking.  Plans to start exercising more. Discussed diet and exercise.  No chest pain or sob reported.  No abdominal pain. Bowels moving. No urine change.  Discussed labs.    Past Medical History:  Diagnosis Date   Arthritis    right hip   Family history of adverse reaction to anesthesia    Mother - hard to wake   GERD (gastroesophageal reflux disease)    Hypertension    Hypothyroidism    Thyroid disease    hypothyroidism   Past Surgical History:  Procedure Laterality Date   BLADDER STEM     CHOLECYSTECTOMY     COLONOSCOPY     COLONOSCOPY WITH PROPOFOL N/A 07/16/2018   Procedure: COLONOSCOPY WITH PROPOFOL;  Surgeon: [name], MD;  Location: [facility];  Service: Endoscopy;  Laterality: N/A;   DILATION AND CURETTAGE OF UTERUS     Family History  Problem Relation Age of Onset   Miscarriages / Stillbirths Mother    Kidney disease Mother    COPD Mother    Asthma Mother    Arthritis Mother    Hypertension Mother    Hyperlipidemia Mother    Epilepsy Mother    Heart attack Father    Hearing loss Father    COPD Father    Arthritis Father    Hearing loss Sister    Hypothyroidism Sister    Diverticulitis Sister    Hyperlipidemia Brother    Hearing loss Brother    Alcohol abuse Brother    Arthritis Brother    Hypertension Brother    Alcohol abuse Brother    Hyperlipidemia Brother    Hypertension Brother    Arthritis Brother    Breast cancer Maternal Aunt 74   Cancer Maternal Grandmother    Arthritis Maternal Grandmother    Pancreatic cancer Maternal Grandmother    Hypertension Maternal Grandmother    Heart failure Maternal Grandfather    Heart attack Paternal Grandfather    Hypertension Paternal Grandfather    Social  History   Socioeconomic History   Marital status: Married    Spouse name: Not on file   Number of children: Not on file   Years of education: Not on file   Highest education level: Not on file  Occupational History   Not on file  Tobacco Use   Smoking status: Never   Smokeless tobacco: Never  Vaping Use   Vaping Use: Never used  Substance and Sexual Activity   Alcohol use: No    Alcohol/week: 0.0 standard drinks of alcohol   Drug use: No   Sexual activity: Not on file  Other Topics Concern   Not on file  Social History Narrative   Not on file   Social Determinants of Health   Financial Resource Strain: Not on file  Food Insecurity: Not on file  Transportation Needs: Not on file  Physical Activity: Not on file  Stress: Not on file  Social Connections: Not on file     Review of Systems  Constitutional:  Negative for appetite change and unexpected weight change.  HENT:  Negative for congestion and sinus pressure.   Respiratory:  Negative for cough, chest tightness and shortness of  breath.   Cardiovascular:  Negative for chest pain, palpitations and leg swelling.  Gastrointestinal:  Negative for abdominal pain, diarrhea, nausea and vomiting.  Genitourinary:  Negative for difficulty urinating and dysuria.  Musculoskeletal:  Negative for joint swelling and myalgias.  Skin:  Negative for color change and rash.  Neurological:  Negative for dizziness, light-headedness and headaches.  Psychiatric/Behavioral:  Negative for agitation and dysphoric mood.        Objective:     BP 132/88 (BP Location: Left Arm, Patient Position: Sitting, Cuff Size: Normal)   Pulse 73   Temp 97.8 F (36.6 C) (Oral)   Ht 5\' 4"  (1.626 m)   Wt 217 lb 9.6 oz (98.7 kg)   SpO2 95%   BMI 37.35 kg/m  Wt Readings from Last 3 Encounters:  09/10/22 217 lb 9.6 oz (98.7 kg)  07/09/22 211 lb 3.2 oz (95.8 kg)  05/14/22 208 lb 12.8 oz (94.7 kg)    Physical Exam Vitals reviewed.  Constitutional:       General: She is not in acute distress.    Appearance: Normal appearance.  HENT:     Head: Normocephalic and atraumatic.     Right Ear: External ear normal.     Left Ear: External ear normal.  Eyes:     General: No scleral icterus.       Right eye: No discharge.        Left eye: No discharge.     Conjunctiva/sclera: Conjunctivae normal.  Neck:     Thyroid: No thyromegaly.  Cardiovascular:     Rate and Rhythm: Normal rate and regular rhythm.  Pulmonary:     Effort: No respiratory distress.     Breath sounds: Normal breath sounds. No wheezing.  Abdominal:     General: Bowel sounds are normal.     Palpations: Abdomen is soft.     Tenderness: There is no abdominal tenderness.  Musculoskeletal:        General: No swelling or tenderness.     Cervical back: Neck supple. No tenderness.  Lymphadenopathy:     Cervical: No cervical adenopathy.  Skin:    Findings: No erythema or rash.  Neurological:     Mental Status: She is alert.  Psychiatric:        Mood and Affect: Mood normal.        Behavior: Behavior normal.      Outpatient Encounter Medications as of 09/10/2022  Medication Sig   aspirin 81 MG EC tablet Take 1 tablet by mouth daily.   cetirizine (ZYRTEC) 10 MG tablet Take 1 tablet by mouth daily.   Cholecalciferol 50 MCG (2000 UT) CAPS Take 1 capsule by mouth daily.   docusate sodium (COLACE) 100 MG capsule Take 2 capsules by mouth daily.   esomeprazole (NEXIUM) 20 MG capsule Take 20 mg by mouth daily.   GOODSENSE ARTHRITIS PAIN 1 % GEL SMARTSIG:2-4 Gram(s) Topical 1-4 Times Daily PRN   levothyroxine (SYNTHROID) 112 MCG tablet Take 1 tablet (112 mcg total) by mouth daily.   losartan (COZAAR) 100 MG tablet Take 1 tablet (100 mg total) by mouth daily.   Melatonin 10 MG TABS Take 1 tablet by mouth daily.   predniSONE (DELTASONE) 10 MG tablet Take 4 tablets x 1 day and then decrease by 1/2 tablet per day until down to zero mg.   No facility-administered encounter  medications on file as of 09/10/2022.     Lab Results  Component Value Date   WBC 5.5 04/01/2022  HGB 12.5 04/01/2022   HCT 37.3 04/01/2022   PLT 286.0 04/01/2022   GLUCOSE 87 09/05/2022   CHOL 206 (H) 09/05/2022   TRIG 114.0 09/05/2022   HDL 54.30 09/05/2022   LDLCALC 129 (H) 09/05/2022   ALT 11 09/05/2022   AST 15 09/05/2022   NA 139 09/05/2022   K 4.3 09/05/2022   CL 103 09/05/2022   CREATININE 0.79 09/05/2022   BUN 16 09/05/2022   CO2 28 09/05/2022   TSH 3.96 09/05/2022   HGBA1C 5.6 12/04/2017       Assessment & Plan:   Problem List Items Addressed This Visit     Adult hypothyroidism    On synthroid.  Follow tsh.       Essential hypertension    Continue losartan 100mg  q day .  Blood pressure improved.  Continue to follow pressures.  Follow metabolic panel.       Relevant Orders   Basic Metabolic Panel (BMET)   Hepatic function panel   Hypercholesterolemia without hypertriglyceridemia    The 10-year ASCVD risk score (Arnett DK, et al., 2019) is: 5.6%   Values used to calculate the score:     Age: 75 years     Sex: Female     Is Non-Hispanic African American: No     Diabetic: No     Tobacco smoker: No     Systolic Blood Pressure: 122 mmHg     Is BP treated: Yes     HDL Cholesterol: 54.3 mg/dL     Total Cholesterol: 206 mg/dL  Low cholesterol diet and exercise.  Follow lipid panel. Discussed.  Information - [facility] Lipid diet given.       Relevant Orders   Lipid panel   CBC with Differential/Platelet   Other Visit Diagnoses     Need for immunization against influenza    -  Primary   Relevant Orders   Flu Vaccine QUAD 44mo+IM (Fluarix, Fluzone & Alfiuria Quad PF) (Completed)        5mo, MD

## 2022-09-10 NOTE — Assessment & Plan Note (Signed)
Continue losartan 100mg  q day .  Blood pressure improved.  Continue to follow pressures.  Follow metabolic panel.

## 2022-09-10 NOTE — Assessment & Plan Note (Signed)
On synthroid.  Follow tsh.   

## 2023-01-07 ENCOUNTER — Other Ambulatory Visit (INDEPENDENT_AMBULATORY_CARE_PROVIDER_SITE_OTHER): Payer: BC Managed Care – PPO

## 2023-01-07 DIAGNOSIS — E7800 Pure hypercholesterolemia, unspecified: Secondary | ICD-10-CM

## 2023-01-07 DIAGNOSIS — I10 Essential (primary) hypertension: Secondary | ICD-10-CM

## 2023-01-07 LAB — HEPATIC FUNCTION PANEL
ALT: 11 U/L (ref 0–35)
AST: 14 U/L (ref 0–37)
Albumin: 4.2 g/dL (ref 3.5–5.2)
Alkaline Phosphatase: 60 U/L (ref 39–117)
Bilirubin, Direct: 0.1 mg/dL (ref 0.0–0.3)
Total Bilirubin: 0.6 mg/dL (ref 0.2–1.2)
Total Protein: 6.6 g/dL (ref 6.0–8.3)

## 2023-01-07 LAB — LIPID PANEL
Cholesterol: 195 mg/dL (ref 0–200)
HDL: 53.5 mg/dL (ref >39.00)
LDL Cholesterol: 127 mg/dL — ABNORMAL HIGH (ref 0–99)
NonHDL: 141.2
Total CHOL/HDL Ratio: 4
Triglycerides: 71 mg/dL (ref 0.0–149.0)
VLDL: 14.2 mg/dL (ref 0.0–40.0)

## 2023-01-07 LAB — CBC WITH DIFFERENTIAL/PLATELET
Basophils Absolute: 0 10*3/uL (ref 0.0–0.1)
Basophils Relative: 0.8 % (ref 0.0–3.0)
Eosinophils Absolute: 0.2 10*3/uL (ref 0.0–0.7)
Eosinophils Relative: 3.3 % (ref 0.0–5.0)
HCT: 34.8 % — ABNORMAL LOW (ref 36.0–46.0)
Hemoglobin: 12.1 g/dL (ref 12.0–15.0)
Lymphocytes Relative: 27.9 % (ref 12.0–46.0)
Lymphs Abs: 1.3 10*3/uL (ref 0.7–4.0)
MCHC: 34.7 g/dL (ref 30.0–36.0)
MCV: 79.5 fl (ref 78.0–100.0)
Monocytes Absolute: 0.4 10*3/uL (ref 0.1–1.0)
Monocytes Relative: 7.6 % (ref 3.0–12.0)
Neutro Abs: 2.8 10*3/uL (ref 1.4–7.7)
Neutrophils Relative %: 60.4 % (ref 43.0–77.0)
Platelets: 292 10*3/uL (ref 150.0–400.0)
RBC: 4.38 Mil/uL (ref 3.87–5.11)
RDW: 14.2 % (ref 11.5–15.5)
WBC: 4.6 10*3/uL (ref 4.0–10.5)

## 2023-01-07 LAB — BASIC METABOLIC PANEL
BUN: 13 mg/dL (ref 6–23)
CO2: 29 mEq/L (ref 19–32)
Calcium: 9.4 mg/dL (ref 8.4–10.5)
Chloride: 103 mEq/L (ref 96–112)
Creatinine, Ser: 0.79 mg/dL (ref 0.40–1.20)
GFR: 79.12 mL/min (ref >60.00)
Glucose, Bld: 91 mg/dL (ref 70–99)
Potassium: 4 mEq/L (ref 3.5–5.1)
Sodium: 139 mEq/L (ref 135–145)

## 2023-01-12 ENCOUNTER — Encounter: Payer: Self-pay | Admitting: Internal Medicine

## 2023-01-12 ENCOUNTER — Ambulatory Visit: Payer: BC Managed Care – PPO | Admitting: Internal Medicine

## 2023-01-12 VITALS — BP 112/70 | HR 79 | Temp 97.9°F | Resp 16 | Ht 64.0 in | Wt 205.8 lb

## 2023-01-12 DIAGNOSIS — Z1231 Encounter for screening mammogram for malignant neoplasm of breast: Secondary | ICD-10-CM

## 2023-01-12 DIAGNOSIS — Z1211 Encounter for screening for malignant neoplasm of colon: Secondary | ICD-10-CM

## 2023-01-12 DIAGNOSIS — E7800 Pure hypercholesterolemia, unspecified: Secondary | ICD-10-CM | POA: Diagnosis not present

## 2023-01-12 DIAGNOSIS — E039 Hypothyroidism, unspecified: Secondary | ICD-10-CM | POA: Diagnosis not present

## 2023-01-12 DIAGNOSIS — I10 Essential (primary) hypertension: Secondary | ICD-10-CM

## 2023-01-12 DIAGNOSIS — K2100 Gastro-esophageal reflux disease with esophagitis, without bleeding: Secondary | ICD-10-CM

## 2023-01-12 MED ORDER — LOSARTAN POTASSIUM 100 MG PO TABS: 100.0000 mg | ORAL_TABLET | Freq: Every day | ORAL | 1 refills | Status: DC

## 2023-01-12 MED ORDER — ESOMEPRAZOLE MAGNESIUM 20 MG PO CPDR: 20.0000 mg | DELAYED_RELEASE_CAPSULE | Freq: Every day | ORAL | 1 refills | Status: DC

## 2023-01-12 NOTE — Progress Notes (Signed)
Subjective:    Patient ID: Kristine Strickland, female    DOB: September 17, 1956, 67 y.o.   MRN: [id number]  Patient here for  Chief Complaint  Patient presents with   Medical Management of Chronic Issues    HPI Here to follow up regarding her blood pressure.  On losartan 100mg  q day. Blood pressure is doing well. Stays active.  Has adjusted her diet.  Lost weight.  Doing well.  No chest pain or sob reported.  No abdominal pain.  Bowels moving.   On nexium.     Past Medical History:  Diagnosis Date   Arthritis    right hip   Family history of adverse reaction to anesthesia    Mother - hard to wake   GERD (gastroesophageal reflux disease)    Hypertension    Hypothyroidism    Thyroid disease    hypothyroidism   Past Surgical History:  Procedure Laterality Date   BLADDER STEM     CHOLECYSTECTOMY     COLONOSCOPY     COLONOSCOPY WITH PROPOFOL N/A 07/16/2018   Procedure: COLONOSCOPY WITH PROPOFOL;  Surgeon: Lucilla Lame, MD;  Location: [place];  Service: Endoscopy;  Laterality: N/A;   DILATION AND CURETTAGE OF UTERUS     Family History  Problem Relation Age of Onset   52 / Stillbirths Mother    Kidney disease Mother    COPD Mother    Asthma Mother    Arthritis Mother    Hypertension Mother    Hyperlipidemia Mother    Epilepsy Mother    Heart attack Father    Hearing loss Father    COPD Father    Arthritis Father    Hearing loss Sister    Hypothyroidism Sister    Diverticulitis Sister    Hyperlipidemia Brother    Hearing loss Brother    Alcohol abuse Brother    Arthritis Brother    Hypertension Brother    Alcohol abuse Brother    Hyperlipidemia Brother    Hypertension Brother    Arthritis Brother    Breast cancer Maternal Aunt 74   Cancer Maternal Grandmother    Arthritis Maternal Grandmother    Pancreatic cancer Maternal Grandmother    Hypertension Maternal Grandmother    Heart failure Maternal Grandfather    Heart attack Paternal  Grandfather    Hypertension Paternal Grandfather    Social History   Socioeconomic History   Marital status: Married    Spouse name: Not on file   Number of children: Not on file   Years of education: Not on file   Highest education level: Not on file  Occupational History   Not on file  Tobacco Use   Smoking status: Never   Smokeless tobacco: Never  Vaping Use   Vaping Use: Never used  Substance and Sexual Activity   Alcohol use: No    Alcohol/week: 0.0 standard drinks of alcohol   Drug use: No   Sexual activity: Not on file  Other Topics Concern   Not on file  Social History Narrative   Not on file   Social Determinants of Health   Financial Resource Strain: Not on file  Food Insecurity: Not on file  Transportation Needs: Not on file  Physical Activity: Not on file  Stress: Not on file  Social Connections: Not on file     Review of Systems  Constitutional:  Negative for appetite change and unexpected weight change.  HENT:  Negative for congestion and sinus  pressure.   Respiratory:  Negative for cough, chest tightness and shortness of breath.   Cardiovascular:  Negative for chest pain, palpitations and leg swelling.  Gastrointestinal:  Negative for abdominal pain, diarrhea, nausea and vomiting.  Genitourinary:  Negative for difficulty urinating and dysuria.  Musculoskeletal:  Negative for joint swelling and myalgias.  Skin:  Negative for color change and rash.  Neurological:  Negative for dizziness and headaches.  Psychiatric/Behavioral:  Negative for agitation and dysphoric mood.        Objective:     BP 112/70   Pulse 79   Temp 97.9 F (36.6 C) (Temporal)   Resp 16   Ht 5\' 4"  (1.626 m)   Wt 205 lb 12.8 oz (93.4 kg)   SpO2 97%   BMI 35.33 kg/m  Wt Readings from Last 3 Encounters:  01/12/23 205 lb 12.8 oz (93.4 kg)  09/10/22 217 lb 9.6 oz (98.7 kg)  07/09/22 211 lb 3.2 oz (95.8 kg)    Physical Exam Vitals reviewed.  Constitutional:       General: She is not in acute distress.    Appearance: Normal appearance.  HENT:     Head: Normocephalic and atraumatic.     Right Ear: External ear normal.     Left Ear: External ear normal.  Eyes:     General: No scleral icterus.       Right eye: No discharge.        Left eye: No discharge.     Conjunctiva/sclera: Conjunctivae normal.  Neck:     Thyroid: No thyromegaly.  Cardiovascular:     Rate and Rhythm: Normal rate and regular rhythm.  Pulmonary:     Effort: No respiratory distress.     Breath sounds: Normal breath sounds. No wheezing.  Abdominal:     General: Bowel sounds are normal.     Palpations: Abdomen is soft.     Tenderness: There is no abdominal tenderness.  Musculoskeletal:        General: No swelling or tenderness.     Cervical back: Neck supple. No tenderness.  Lymphadenopathy:     Cervical: No cervical adenopathy.  Skin:    Findings: No erythema or rash.  Neurological:     Mental Status: She is alert.  Psychiatric:        Mood and Affect: Mood normal.        Behavior: Behavior normal.      Outpatient Encounter Medications as of 01/12/2023  Medication Sig   aspirin 81 MG EC tablet Take 1 tablet by mouth daily.   cetirizine (ZYRTEC) 10 MG tablet Take 1 tablet by mouth daily.   Cholecalciferol 50 MCG (2000 UT) CAPS Take 1 capsule by mouth daily.   docusate sodium (COLACE) 100 MG capsule Take 2 capsules by mouth daily.   esomeprazole (NEXIUM) 20 MG capsule Take 1 capsule (20 mg total) by mouth daily.   GOODSENSE ARTHRITIS PAIN 1 % GEL SMARTSIG:2-4 Gram(s) Topical 1-4 Times Daily PRN   levothyroxine (SYNTHROID) 112 MCG tablet Take 1 tablet (112 mcg total) by mouth daily.   losartan (COZAAR) 100 MG tablet Take 1 tablet (100 mg total) by mouth daily.   Melatonin 10 MG TABS Take 1 tablet by mouth daily.   [DISCONTINUED] esomeprazole (NEXIUM) 20 MG capsule Take 20 mg by mouth daily.   [DISCONTINUED] losartan (COZAAR) 100 MG tablet Take 1 tablet (100 mg total)  by mouth daily.   [DISCONTINUED] predniSONE (DELTASONE) 10 MG tablet Take 4 tablets x  1 day and then decrease by 1/2 tablet per day until down to zero mg.   No facility-administered encounter medications on file as of 01/12/2023.     Lab Results  Component Value Date   WBC 4.6 01/07/2023   HGB 12.1 01/07/2023   HCT 34.8 (L) 01/07/2023   PLT 292.0 01/07/2023   GLUCOSE 91 01/07/2023   CHOL 195 01/07/2023   TRIG 71.0 01/07/2023   HDL 53.50 01/07/2023   LDLCALC 127 (H) 01/07/2023   ALT 11 01/07/2023   AST 14 01/07/2023   NA 139 01/07/2023   K 4.0 01/07/2023   CL 103 01/07/2023   CREATININE 0.79 01/07/2023   BUN 13 01/07/2023   CO2 29 01/07/2023   TSH 3.96 09/05/2022   HGBA1C 5.6 12/04/2017    No results found.     Assessment & Plan:  Essential hypertension Assessment & Plan: Continue losartan 100mg  q day .  Blood pressure as outlined. Continue to follow pressures.  Follow metabolic panel.   Orders: -     Basic metabolic panel; Future  Hypercholesterolemia without hypertriglyceridemia Assessment & Plan: The 10-year ASCVD risk score (Arnett DK, et al., 2019) is: 7.1%   Values used to calculate the score:     Age: 59 years     Sex: Female     Is Non-Hispanic African American: No     Diabetic: No     Tobacco smoker: No     Systolic Blood Pressure: 132 mmHg     Is BP treated: Yes     HDL Cholesterol: 53.5 mg/dL     Total Cholesterol: 195 mg/dL  Low cholesterol diet and exercise.  Follow lipid panel.     Orders: -     Lipid panel; Future -     Hepatic function panel; Future  Visit for screening mammogram -     3D Screening Mammogram, Left and Right; Future  Adult hypothyroidism Assessment & Plan: On synthroid.  Follow tsh.   Orders: -     TSH; Future  Colon cancer screening Assessment & Plan: Colonoscopy 2019.  Recommended f/u in 10 years.    Gastroesophageal reflux disease with esophagitis without hemorrhage Assessment & Plan: On nexium.  No upper  symptoms reported.  Follow.    Other orders -     Losartan Potassium; Take 1 tablet (100 mg total) by mouth daily.  Dispense: 90 tablet; Refill: 1 -     Esomeprazole Magnesium; Take 1 capsule (20 mg total) by mouth daily.  Dispense: 90 capsule; Refill: 1     2020, MD

## 2023-01-12 NOTE — Assessment & Plan Note (Signed)
On synthroid.  Follow tsh.   

## 2023-01-12 NOTE — Assessment & Plan Note (Signed)
Continue losartan 100mg  q day .  Blood pressure as outlined. Continue to follow pressures.  Follow metabolic panel.

## 2023-01-12 NOTE — Assessment & Plan Note (Signed)
On nexium.  No upper symptoms reported.  Follow.  

## 2023-01-12 NOTE — Assessment & Plan Note (Signed)
The 10-year ASCVD risk score (Arnett DK, et al., 2019) is: 7.1%   Values used to calculate the score:     Age: 67 years     Sex: Female     Is Non-Hispanic African American: No     Diabetic: No     Tobacco smoker: No     Systolic Blood Pressure: 007 mmHg     Is BP treated: Yes     HDL Cholesterol: 53.5 mg/dL     Total Cholesterol: 195 mg/dL  Low cholesterol diet and exercise.  Follow lipid panel.

## 2023-01-12 NOTE — Assessment & Plan Note (Signed)
Colonoscopy 2019.  Recommended f/u in 10 years.

## 2023-04-24 ENCOUNTER — Ambulatory Visit
Admission: RE | Admit: 2023-04-24 | Discharge: 2023-04-24 | Disposition: A | Discharge status: Home / Self Care | Payer: BC Managed Care – PPO | Source: Ambulatory Visit | Attending: Internal Medicine | Admitting: Internal Medicine

## 2023-04-24 ENCOUNTER — Other Ambulatory Visit: Payer: Self-pay | Admitting: Internal Medicine

## 2023-04-24 DIAGNOSIS — Z1231 Encounter for screening mammogram for malignant neoplasm of breast: Secondary | ICD-10-CM | POA: Diagnosis not present

## 2023-04-27 ENCOUNTER — Other Ambulatory Visit: Payer: Self-pay | Admitting: Internal Medicine

## 2023-04-27 DIAGNOSIS — R928 Other abnormal and inconclusive findings on diagnostic imaging of breast: Secondary | ICD-10-CM

## 2023-04-27 DIAGNOSIS — N6489 Other specified disorders of breast: Secondary | ICD-10-CM

## 2023-05-05 ENCOUNTER — Ambulatory Visit
Admission: RE | Admit: 2023-05-05 | Discharge: 2023-05-05 | Disposition: A | Discharge status: Home / Self Care | Payer: BC Managed Care – PPO | Source: Ambulatory Visit | Attending: Internal Medicine | Admitting: Internal Medicine

## 2023-05-05 DIAGNOSIS — R928 Other abnormal and inconclusive findings on diagnostic imaging of breast: Secondary | ICD-10-CM | POA: Insufficient documentation

## 2023-05-05 DIAGNOSIS — N6489 Other specified disorders of breast: Secondary | ICD-10-CM | POA: Diagnosis not present

## 2023-05-05 DIAGNOSIS — N6321 Unspecified lump in the left breast, upper outer quadrant: Secondary | ICD-10-CM | POA: Diagnosis not present

## 2023-05-11 ENCOUNTER — Other Ambulatory Visit (INDEPENDENT_AMBULATORY_CARE_PROVIDER_SITE_OTHER): Payer: BC Managed Care – PPO

## 2023-05-11 DIAGNOSIS — E7800 Pure hypercholesterolemia, unspecified: Secondary | ICD-10-CM

## 2023-05-11 DIAGNOSIS — E039 Hypothyroidism, unspecified: Secondary | ICD-10-CM | POA: Diagnosis not present

## 2023-05-11 DIAGNOSIS — I10 Essential (primary) hypertension: Secondary | ICD-10-CM

## 2023-05-11 LAB — LIPID PANEL
Cholesterol: 245 mg/dL — ABNORMAL HIGH (ref 0–200)
HDL: 59.7 mg/dL (ref >39.00)
LDL Cholesterol: 160 mg/dL — ABNORMAL HIGH (ref 0–99)
NonHDL: 185.04
Total CHOL/HDL Ratio: 4
Triglycerides: 124 mg/dL (ref 0.0–149.0)
VLDL: 24.8 mg/dL (ref 0.0–40.0)

## 2023-05-11 LAB — BASIC METABOLIC PANEL
BUN: 16 mg/dL (ref 6–23)
CO2: 30 mEq/L (ref 19–32)
Calcium: 10 mg/dL (ref 8.4–10.5)
Chloride: 101 mEq/L (ref 96–112)
Creatinine, Ser: 0.73 mg/dL (ref 0.40–1.20)
GFR: 86.77 mL/min (ref >60.00)
Glucose, Bld: 93 mg/dL (ref 70–99)
Potassium: 4.3 mEq/L (ref 3.5–5.1)
Sodium: 140 mEq/L (ref 135–145)

## 2023-05-11 LAB — HEPATIC FUNCTION PANEL
ALT: 10 U/L (ref 0–35)
AST: 14 U/L (ref 0–37)
Albumin: 4.2 g/dL (ref 3.5–5.2)
Alkaline Phosphatase: 67 U/L (ref 39–117)
Bilirubin, Direct: 0.1 mg/dL (ref 0.0–0.3)
Total Bilirubin: 0.4 mg/dL (ref 0.2–1.2)
Total Protein: 7 g/dL (ref 6.0–8.3)

## 2023-05-12 LAB — TSH: TSH: 1.03 u[IU]/mL (ref 0.35–5.50)

## 2023-05-13 ENCOUNTER — Ambulatory Visit (INDEPENDENT_AMBULATORY_CARE_PROVIDER_SITE_OTHER): Payer: BC Managed Care – PPO | Admitting: Internal Medicine

## 2023-05-13 ENCOUNTER — Encounter: Payer: Self-pay | Admitting: Internal Medicine

## 2023-05-13 VITALS — BP 136/74 | HR 72 | Temp 97.9°F | Resp 16 | Ht 64.0 in | Wt 200.6 lb

## 2023-05-13 DIAGNOSIS — E039 Hypothyroidism, unspecified: Secondary | ICD-10-CM | POA: Diagnosis not present

## 2023-05-13 DIAGNOSIS — E7800 Pure hypercholesterolemia, unspecified: Secondary | ICD-10-CM | POA: Diagnosis not present

## 2023-05-13 DIAGNOSIS — K2100 Gastro-esophageal reflux disease with esophagitis, without bleeding: Secondary | ICD-10-CM | POA: Diagnosis not present

## 2023-05-13 DIAGNOSIS — Z1211 Encounter for screening for malignant neoplasm of colon: Secondary | ICD-10-CM

## 2023-05-13 DIAGNOSIS — I10 Essential (primary) hypertension: Secondary | ICD-10-CM

## 2023-05-13 DIAGNOSIS — D508 Other iron deficiency anemias: Secondary | ICD-10-CM | POA: Diagnosis not present

## 2023-05-13 DIAGNOSIS — Z0000 Encounter for general adult medical examination without abnormal findings: Secondary | ICD-10-CM

## 2023-05-13 NOTE — Assessment & Plan Note (Addendum)
Physical not performed today.  PAP 05/14/22 - negative with negative HPV. Colonoscopy 2019.  Recommended f/u in 10 years. Mammogram 04/27/23 - Birads 0. Mammogram f/u left breast diagnostic mammogram (05/05/23) - Birads II.  Recommended continue annual screening.

## 2023-05-13 NOTE — Progress Notes (Signed)
Subjective:    Patient ID: Kristine Strickland, female    DOB: 1956/06/14, 67 y.o.   MRN: [id number]  Patient here for  Chief Complaint  Patient presents with   Medical Management of Chronic Issues    HPI Was scheduled for a physical.  Too early.  Changed appt to f/u appt.  On losartan for her blood pressure.  Blood pressures on outside checks - 130s systolic range.  No chest pain or sob reported.  No cough or congestion.  No acid reflux.  No abdominal pain or bowel change reported.     Past Medical History:  Diagnosis Date   Arthritis    right hip   Family history of adverse reaction to anesthesia    Mother - hard to wake   GERD (gastroesophageal reflux disease)    Hypertension    Hypothyroidism    Thyroid disease    hypothyroidism   Past Surgical History:  Procedure Laterality Date   BLADDER STEM     CHOLECYSTECTOMY     COLONOSCOPY     COLONOSCOPY WITH PROPOFOL N/A 07/16/2018   Procedure: COLONOSCOPY WITH PROPOFOL;  Surgeon: [name], MD;  Location: [facility];  Service: Endoscopy;  Laterality: N/A;   DILATION AND CURETTAGE OF UTERUS     Family History  Problem Relation Age of Onset   Miscarriages / Stillbirths Mother    Kidney disease Mother    COPD Mother    Asthma Mother    Arthritis Mother    Hypertension Mother    Hyperlipidemia Mother    Epilepsy Mother    Heart attack Father    Hearing loss Father    COPD Father    Arthritis Father    Hearing loss Sister    Hypothyroidism Sister    Diverticulitis Sister    Hyperlipidemia Brother    Hearing loss Brother    Alcohol abuse Brother    Arthritis Brother    Hypertension Brother    Alcohol abuse Brother    Hyperlipidemia Brother    Hypertension Brother    Arthritis Brother    Breast cancer Maternal Aunt 74   Cancer Maternal Grandmother    Arthritis Maternal Grandmother    Pancreatic cancer Maternal Grandmother    Hypertension Maternal Grandmother    Heart failure Maternal Grandfather     Heart attack Paternal Grandfather    Hypertension Paternal Grandfather    Social History   Socioeconomic History   Marital status: Married    Spouse name: Not on file   Number of children: Not on file   Years of education: Not on file   Highest education level: Not on file  Occupational History   Not on file  Tobacco Use   Smoking status: Never   Smokeless tobacco: Never  Vaping Use   Vaping Use: Never used  Substance and Sexual Activity   Alcohol use: No    Alcohol/week: 0.0 standard drinks of alcohol   Drug use: No   Sexual activity: Not on file  Other Topics Concern   Not on file  Social History Narrative   Not on file   Social Determinants of Health   Financial Resource Strain: Not on file  Food Insecurity: Not on file  Transportation Needs: Not on file  Physical Activity: Not on file  Stress: Not on file  Social Connections: Not on file     Review of Systems  Constitutional:  Negative for appetite change and unexpected weight change.  HENT:  Negative for congestion and sinus pressure.   Respiratory:  Negative for cough, chest tightness and shortness of breath.   Cardiovascular:  Negative for chest pain and palpitations.  Gastrointestinal:  Negative for abdominal pain, diarrhea, nausea and vomiting.  Genitourinary:  Negative for difficulty urinating and dysuria.  Musculoskeletal:  Negative for joint swelling and myalgias.  Skin:  Negative for color change and rash.  Neurological:  Negative for dizziness and headaches.  Psychiatric/Behavioral:  Negative for agitation and dysphoric mood.        Objective:     BP 136/74   Pulse 72   Temp 97.9 F (36.6 C)   Resp 16   Ht 5\' 4"  (1.626 m)   Wt 200 lb 9.6 oz (91 kg)   SpO2 98%   BMI 34.43 kg/m  Wt Readings from Last 3 Encounters:  05/13/23 200 lb 9.6 oz (91 kg)  01/12/23 205 lb 12.8 oz (93.4 kg)  09/10/22 217 lb 9.6 oz (98.7 kg)    Physical Exam Vitals reviewed.  Constitutional:      General: She  is not in acute distress.    Appearance: Normal appearance.  HENT:     Head: Normocephalic and atraumatic.     Right Ear: External ear normal.     Left Ear: External ear normal.  Eyes:     General: No scleral icterus.       Right eye: No discharge.        Left eye: No discharge.     Conjunctiva/sclera: Conjunctivae normal.  Neck:     Thyroid: No thyromegaly.  Cardiovascular:     Rate and Rhythm: Normal rate and regular rhythm.  Pulmonary:     Effort: No respiratory distress.     Breath sounds: Normal breath sounds. No wheezing.  Abdominal:     General: Bowel sounds are normal.     Palpations: Abdomen is soft.     Tenderness: There is no abdominal tenderness.  Musculoskeletal:        General: No swelling or tenderness.     Cervical back: Neck supple. No tenderness.  Lymphadenopathy:     Cervical: No cervical adenopathy.  Skin:    Findings: No erythema or rash.  Neurological:     Mental Status: She is alert.  Psychiatric:        Mood and Affect: Mood normal.        Behavior: Behavior normal.      Outpatient Encounter Medications as of 05/13/2023  Medication Sig   aspirin 81 MG EC tablet Take 1 tablet by mouth daily.   cetirizine (ZYRTEC) 10 MG tablet Take 1 tablet by mouth daily.   Cholecalciferol 50 MCG (2000 UT) CAPS Take 1 capsule by mouth daily.   docusate sodium (COLACE) 100 MG capsule Take 2 capsules by mouth daily.   esomeprazole (NEXIUM) 20 MG capsule Take 1 capsule (20 mg total) by mouth daily.   GOODSENSE ARTHRITIS PAIN 1 % GEL SMARTSIG:2-4 Gram(s) Topical 1-4 Times Daily PRN   levothyroxine (SYNTHROID) 112 MCG tablet TAKE 1 TABLET(112 MCG) BY MOUTH DAILY   losartan (COZAAR) 100 MG tablet Take 1 tablet (100 mg total) by mouth daily.   Melatonin 10 MG TABS Take 1 tablet by mouth daily.   No facility-administered encounter medications on file as of 05/13/2023.     Lab Results  Component Value Date   WBC 4.6 01/07/2023   HGB 12.1 01/07/2023   HCT 34.8 (L)  01/07/2023   PLT 292.0 01/07/2023  GLUCOSE 93 05/11/2023   CHOL 245 (H) 05/11/2023   TRIG 124.0 05/11/2023   HDL 59.70 05/11/2023   LDLCALC 160 (H) 05/11/2023   ALT 10 05/11/2023   AST 14 05/11/2023   NA 140 05/11/2023   K 4.3 05/11/2023   CL 101 05/11/2023   CREATININE 0.73 05/11/2023   BUN 16 05/11/2023   CO2 30 05/11/2023   TSH 1.03 05/11/2023   HGBA1C 5.6 12/04/2017    MM 3D DIAGNOSTIC MAMMOGRAM UNILATERAL LEFT BREAST  Result Date: 05/05/2023 CLINICAL DATA:  Recalled from screening for left breast mass. EXAM: DIGITAL DIAGNOSTIC UNILATERAL LEFT MAMMOGRAM WITH TOMOSYNTHESIS; ULTRASOUND LEFT BREAST LIMITED TECHNIQUE: Left digital diagnostic mammography and breast tomosynthesis was performed.; Targeted ultrasound examination of the left breast was performed. COMPARISON:  Previous exam(s). ACR Breast Density Category b: There are scattered areas of fibroglandular density. FINDINGS: Persistent low-density oval mass within the upper-outer left breast, further evaluated with spot compression views. Targeted ultrasound is performed, showing a 5 x 2 x 3 mm cyst left breast 2 o'clock position 4 cm from the nipple. IMPRESSION: Left breast cyst.  No mammographic evidence for malignancy. RECOMMENDATION: Screening mammogram in one year.(Code:SM-B-01Y) I have discussed the findings and recommendations with the patient. If applicable, a reminder letter will be sent to the patient regarding the next appointment. BI-RADS CATEGORY  2: Benign. Electronically Signed   By: Annia Belt M.D.   On: 05/05/2023 11:46   [place] LIMITED ULTRASOUND INCLUDING AXILLA LEFT BREAST   Result Date: 05/05/2023 CLINICAL DATA:  Recalled from screening for left breast mass. EXAM: DIGITAL DIAGNOSTIC UNILATERAL LEFT MAMMOGRAM WITH TOMOSYNTHESIS; ULTRASOUND LEFT BREAST LIMITED TECHNIQUE: Left digital diagnostic mammography and breast tomosynthesis was performed.; Targeted ultrasound examination of the left breast was performed.  COMPARISON:  Previous exam(s). ACR Breast Density Category b: There are scattered areas of fibroglandular density. FINDINGS: Persistent low-density oval mass within the upper-outer left breast, further evaluated with spot compression views. Targeted ultrasound is performed, showing a 5 x 2 x 3 mm cyst left breast 2 o'clock position 4 cm from the nipple. IMPRESSION: Left breast cyst.  No mammographic evidence for malignancy. RECOMMENDATION: Screening mammogram in one year.(Code:SM-B-01Y) I have discussed the findings and recommendations with the patient. If applicable, a reminder letter will be sent to the patient regarding the next appointment. BI-RADS CATEGORY  2: Benign. Electronically Signed   By: Annia Belt M.D.   On: 05/05/2023 11:46      Assessment & Plan:  Hypercholesterolemia without hypertriglyceridemia Assessment & Plan: The 10-year ASCVD risk score (Arnett DK, et al., 2019) is: 5.6%   Values used to calculate the score:     Age: 59 years     Sex: Female     Is Non-Hispanic African American: No     Diabetic: No     Tobacco smoker: No     Systolic Blood Pressure: 112 mmHg     Is BP treated: Yes     HDL Cholesterol: 59.7 mg/dL     Total Cholesterol: 245 mg/dL  Low cholesterol diet and exercise.  Follow lipid panel.   Orders: -     Lipid panel; Future -     Hepatic function panel; Future -     Basic metabolic panel; Future  Healthcare maintenance Assessment & Plan: Physical not performed today.  PAP 05/14/22 - negative with negative HPV. Colonoscopy 2019.  Recommended f/u in 10 years. Mammogram 04/27/23 - Birads 0. Mammogram f/u left breast diagnostic mammogram (05/05/23) - Birads II.  Recommended continue annual screening.    Other iron deficiency anemia Assessment & Plan: Has a documented history of anemia.  Follow cbc.    Adult hypothyroidism Assessment & Plan: On synthroid.  Follow tsh.    Colon cancer screening Assessment & Plan: Colonoscopy 2019.  Recommended f/u  in 10 years.    Gastroesophageal reflux disease with esophagitis without hemorrhage Assessment & Plan: On nexium.  No upper symptoms reported.  Follow.    Essential hypertension Assessment & Plan: Continue losartan 100mg  q day .  Blood pressure as outlined. Continue to follow pressures.  Follow metabolic panel.       Dale Iva, MD

## 2023-05-13 NOTE — Assessment & Plan Note (Addendum)
The 10-year ASCVD risk score (Arnett DK, et al., 2019) is: 5.6%   Values used to calculate the score:     Age: 67 years     Sex: Female     Is Non-Hispanic African American: No     Diabetic: No     Tobacco smoker: No     Systolic Blood Pressure: 112 mmHg     Is BP treated: Yes     HDL Cholesterol: 59.7 mg/dL     Total Cholesterol: 245 mg/dL  Low cholesterol diet and exercise.  Follow lipid panel.

## 2023-05-18 ENCOUNTER — Encounter: Payer: Self-pay | Admitting: Internal Medicine

## 2023-05-18 NOTE — Assessment & Plan Note (Signed)
Continue losartan 100mg q day .  Blood pressure as outlined. Continue to follow pressures.  Follow metabolic panel.  

## 2023-05-18 NOTE — Assessment & Plan Note (Signed)
Colonoscopy 2019.  Recommended f/u in 10 years.  

## 2023-05-18 NOTE — Assessment & Plan Note (Signed)
On nexium.  No upper symptoms reported.  Follow.  

## 2023-05-18 NOTE — Assessment & Plan Note (Signed)
Has a documented history of anemia.  Follow cbc.   

## 2023-05-18 NOTE — Assessment & Plan Note (Signed)
On synthroid.  Follow tsh.   

## 2023-07-21 ENCOUNTER — Other Ambulatory Visit: Payer: Self-pay | Admitting: Internal Medicine

## 2023-08-06 ENCOUNTER — Encounter (INDEPENDENT_AMBULATORY_CARE_PROVIDER_SITE_OTHER): Payer: Self-pay

## 2023-09-09 ENCOUNTER — Other Ambulatory Visit: Payer: Self-pay | Admitting: Internal Medicine

## 2023-09-15 ENCOUNTER — Other Ambulatory Visit: Payer: BC Managed Care – PPO

## 2023-09-18 ENCOUNTER — Encounter: Payer: BC Managed Care – PPO | Admitting: Internal Medicine

## 2023-09-21 ENCOUNTER — Encounter: Payer: Self-pay | Admitting: Internal Medicine

## 2023-09-21 ENCOUNTER — Other Ambulatory Visit: Payer: Self-pay

## 2023-09-21 MED ORDER — LOSARTAN POTASSIUM 100 MG PO TABS: 100.0000 mg | ORAL_TABLET | Freq: Every day | ORAL | 1 refills | Status: DC

## 2023-10-01 ENCOUNTER — Other Ambulatory Visit (INDEPENDENT_AMBULATORY_CARE_PROVIDER_SITE_OTHER): Payer: BLUE CROSS/BLUE SHIELD

## 2023-10-01 DIAGNOSIS — E7800 Pure hypercholesterolemia, unspecified: Secondary | ICD-10-CM

## 2023-10-01 LAB — LIPID PANEL
Cholesterol: 203 mg/dL — ABNORMAL HIGH (ref 0–200)
HDL: 52.5 mg/dL (ref >39.00)
LDL Cholesterol: 134 mg/dL — ABNORMAL HIGH (ref 0–99)
NonHDL: 150.31
Total CHOL/HDL Ratio: 4
Triglycerides: 84 mg/dL (ref 0.0–149.0)
VLDL: 16.8 mg/dL (ref 0.0–40.0)

## 2023-10-01 LAB — HEPATIC FUNCTION PANEL
ALT: 11 U/L (ref 0–35)
AST: 15 U/L (ref 0–37)
Albumin: 4.3 g/dL (ref 3.5–5.2)
Alkaline Phosphatase: 57 U/L (ref 39–117)
Bilirubin, Direct: 0.1 mg/dL (ref 0.0–0.3)
Total Bilirubin: 0.7 mg/dL (ref 0.2–1.2)
Total Protein: 6.7 g/dL (ref 6.0–8.3)

## 2023-10-01 LAB — BASIC METABOLIC PANEL
BUN: 13 mg/dL (ref 6–23)
CO2: 31 meq/L (ref 19–32)
Calcium: 9.9 mg/dL (ref 8.4–10.5)
Chloride: 103 meq/L (ref 96–112)
Creatinine, Ser: 0.72 mg/dL (ref 0.40–1.20)
GFR: 87.98 mL/min (ref >60.00)
Glucose, Bld: 97 mg/dL (ref 70–99)
Potassium: 3.8 meq/L (ref 3.5–5.1)
Sodium: 140 meq/L (ref 135–145)

## 2023-10-06 ENCOUNTER — Telehealth: Payer: Self-pay | Admitting: Internal Medicine

## 2023-10-06 ENCOUNTER — Encounter: Payer: Self-pay | Admitting: Internal Medicine

## 2023-10-06 ENCOUNTER — Ambulatory Visit (INDEPENDENT_AMBULATORY_CARE_PROVIDER_SITE_OTHER): Payer: BLUE CROSS/BLUE SHIELD | Admitting: Internal Medicine

## 2023-10-06 VITALS — BP 114/72 | HR 74 | Temp 98.3°F | Resp 16 | Ht 64.0 in | Wt 191.0 lb

## 2023-10-06 DIAGNOSIS — E7800 Pure hypercholesterolemia, unspecified: Secondary | ICD-10-CM | POA: Diagnosis not present

## 2023-10-06 DIAGNOSIS — K2100 Gastro-esophageal reflux disease with esophagitis, without bleeding: Secondary | ICD-10-CM | POA: Diagnosis not present

## 2023-10-06 DIAGNOSIS — I10 Essential (primary) hypertension: Secondary | ICD-10-CM | POA: Diagnosis not present

## 2023-10-06 DIAGNOSIS — Z0000 Encounter for general adult medical examination without abnormal findings: Secondary | ICD-10-CM | POA: Diagnosis not present

## 2023-10-06 DIAGNOSIS — E039 Hypothyroidism, unspecified: Secondary | ICD-10-CM

## 2023-10-06 NOTE — Telephone Encounter (Signed)
States she has a pneumonia vaccine at [facility].  Need to know which pneumonia vaccine she had, to be able to let her know which pneumonia vaccine she needs.  Reports had around age 67.

## 2023-10-06 NOTE — Assessment & Plan Note (Signed)
Physical today 10/06/23. PAP 05/14/22 - negative with negative HPV. Colonoscopy 2019.  Recommended f/u in 10 years. Mammogram 04/27/23 - Birads 0. Mammogram f/u left breast diagnostic mammogram (05/05/23) - Birads II.  Recommended continue annual screening.

## 2023-10-06 NOTE — Telephone Encounter (Signed)
Please notify pt.  She would need prevnar 20

## 2023-10-06 NOTE — Telephone Encounter (Signed)
No record of her having a pneumonia shot in [place] or in the records at [facility].

## 2023-10-06 NOTE — Assessment & Plan Note (Signed)
On synthroid.  Follow tsh.

## 2023-10-06 NOTE — Assessment & Plan Note (Signed)
Continue losartan 100mg  q day .  Blood pressure as outlined. Continue to follow pressures.  Follow metabolic panel.

## 2023-10-06 NOTE — Assessment & Plan Note (Signed)
The 10-year ASCVD risk score (Arnett DK, et al., 2019) is: 6.1%   Values used to calculate the score:     Age: 67 years     Sex: Female     Is Non-Hispanic African American: No     Diabetic: No     Tobacco smoker: No     Systolic Blood Pressure: 114 mmHg     Is BP treated: Yes     HDL Cholesterol: 52.5 mg/dL     Total Cholesterol: 203 mg/dL  Low cholesterol diet and exercise.  Follow lipid panel.

## 2023-10-06 NOTE — Assessment & Plan Note (Signed)
On nexium.  No upper symptoms reported.  Follow.

## 2023-10-06 NOTE — Progress Notes (Signed)
Subjective:    Patient ID: Kristine Strickland, female    DOB: [birth year], 67 y.o.   MRN: [id number]  Patient here for  Chief Complaint  Patient presents with   Annual Exam    HPI Here for a physical exam. On losartan for her blood pressure. Blood pressure is doing well.  Reviewed outside blood pressure readings.  Most blood pressures averaging 111-120s/70s.  No chest pain or sob reported.  No abdominal pain or bowel change reported. Staying active.    Past Medical History:  Diagnosis Date   Arthritis    right hip   Family history of adverse reaction to anesthesia    Mother - hard to wake   GERD (gastroesophageal reflux disease)    Hypertension    Hypothyroidism    Thyroid disease    hypothyroidism   Past Surgical History:  Procedure Laterality Date   BLADDER STEM     CHOLECYSTECTOMY     COLONOSCOPY     COLONOSCOPY WITH PROPOFOL N/A 07/16/2018   Procedure: COLONOSCOPY WITH PROPOFOL;  Surgeon: [name], MD;  Location: [facility];  Service: Endoscopy;  Laterality: N/A;   DILATION AND CURETTAGE OF UTERUS     Family History  Problem Relation Age of Onset   Miscarriages / Stillbirths Mother    Kidney disease Mother    COPD Mother    Asthma Mother    Arthritis Mother    Hypertension Mother    Hyperlipidemia Mother    Epilepsy Mother    Heart attack Father    Hearing loss Father    COPD Father    Arthritis Father    Hearing loss Sister    Hypothyroidism Sister    Diverticulitis Sister    Hyperlipidemia Brother    Hearing loss Brother    Alcohol abuse Brother    Arthritis Brother    Hypertension Brother    Alcohol abuse Brother    Hyperlipidemia Brother    Hypertension Brother    Arthritis Brother    Breast cancer Maternal Aunt 74   Cancer Maternal Grandmother    Arthritis Maternal Grandmother    Pancreatic cancer Maternal Grandmother    Hypertension Maternal Grandmother    Heart failure Maternal Grandfather    Heart attack Paternal Grandfather     Hypertension Paternal Grandfather    Social History   Socioeconomic History   Marital status: Married    Spouse name: Not on file   Number of children: Not on file   Years of education: Not on file   Highest education level: Not on file  Occupational History   Not on file  Tobacco Use   Smoking status: Never   Smokeless tobacco: Never  Vaping Use   Vaping status: Never Used  Substance and Sexual Activity   Alcohol use: No    Alcohol/week: 0.0 standard drinks of alcohol   Drug use: No   Sexual activity: Not on file  Other Topics Concern   Not on file  Social History Narrative   Not on file   Social Determinants of Health   Financial Resource Strain: Low Risk  (10/06/2023)   Overall Financial Resource Strain (CARDIA)    Difficulty of Paying Living Expenses: Not hard at all  Food Insecurity: No Food Insecurity (10/06/2023)   Hunger Vital Sign    Worried About Running Out of Food in the Last Year: Never true    Ran Out of Food in the Last Year: Never true  Transportation Needs: No  Transportation Needs (10/06/2023)   PRAPARE - Administrator, Civil Service (Medical): No    Lack of Transportation (Non-Medical): No  Physical Activity: Unknown (10/06/2023)   Exercise Vital Sign    Days of Exercise per Week: 5 days    Minutes of Exercise per Session: Not on file  Stress: No Stress Concern Present (10/06/2023)   [employer] of Occupational Health - Occupational Stress Questionnaire    Feeling of Stress : Not at all  Social Connections: Socially Integrated (10/06/2023)   Social Connection and Isolation Panel [NHANES]    Frequency of Communication with Friends and Family: More than three times a week    Frequency of Social Gatherings with Friends and Family: More than three times a week    Attends Religious Services: More than 4 times per year    Active Member of [employer] or Organizations: Yes    Attends Engineer, structural: More than 4 times per  year    Marital Status: Married     Review of Systems  Constitutional:  Negative for appetite change and unexpected weight change.  HENT:  Negative for congestion, sinus pressure and sore throat.   Eyes:  Negative for pain and visual disturbance.  Respiratory:  Negative for cough, chest tightness and shortness of breath.   Cardiovascular:  Negative for chest pain, palpitations and leg swelling.  Gastrointestinal:  Negative for abdominal pain, diarrhea, nausea and vomiting.  Genitourinary:  Negative for difficulty urinating and dysuria.  Musculoskeletal:  Negative for joint swelling and myalgias.  Skin:  Negative for color change and rash.  Neurological:  Negative for dizziness and headaches.  Hematological:  Negative for adenopathy. Does not bruise/bleed easily.  Psychiatric/Behavioral:  Negative for agitation and dysphoric mood.        Objective:     BP 114/72   Pulse 74   Temp 98.3 F (36.8 C)   Resp 16   Ht 5\' 4"  (1.626 m)   Wt 191 lb (86.6 kg)   SpO2 98%   BMI 32.79 kg/m  Wt Readings from Last 3 Encounters:  10/06/23 191 lb (86.6 kg)  05/13/23 200 lb 9.6 oz (91 kg)  01/12/23 205 lb 12.8 oz (93.4 kg)    Physical Exam Vitals reviewed.  Constitutional:      General: She is not in acute distress.    Appearance: Normal appearance. She is well-developed.  HENT:     Head: Normocephalic and atraumatic.     Right Ear: External ear normal.     Left Ear: External ear normal.  Eyes:     General: No scleral icterus.       Right eye: No discharge.        Left eye: No discharge.     Conjunctiva/sclera: Conjunctivae normal.  Neck:     Thyroid: No thyromegaly.  Cardiovascular:     Rate and Rhythm: Normal rate and regular rhythm.  Pulmonary:     Effort: No tachypnea, accessory muscle usage or respiratory distress.     Breath sounds: Normal breath sounds. No decreased breath sounds or wheezing.  Chest:  Breasts:    Right: No inverted nipple, mass, nipple discharge or  tenderness (no axillary adenopathy).     Left: No inverted nipple, mass, nipple discharge or tenderness (no axilarry adenopathy).  Abdominal:     General: Bowel sounds are normal.     Palpations: Abdomen is soft.     Tenderness: There is no abdominal tenderness.  Musculoskeletal:  General: No swelling or tenderness.     Cervical back: Neck supple. No tenderness.  Lymphadenopathy:     Cervical: No cervical adenopathy.  Skin:    Findings: No erythema or rash.  Neurological:     Mental Status: She is alert and oriented to person, place, and time.  Psychiatric:        Mood and Affect: Mood normal.        Behavior: Behavior normal.      Outpatient Encounter Medications as of 10/06/2023  Medication Sig   aspirin 81 MG EC tablet Take 1 tablet by mouth daily.   cetirizine (ZYRTEC) 10 MG tablet Take 1 tablet by mouth daily.   Cholecalciferol 50 MCG (2000 UT) CAPS Take 1 capsule by mouth daily.   docusate sodium (COLACE) 100 MG capsule Take 2 capsules by mouth daily.   esomeprazole (NEXIUM) 20 MG capsule Take 1 capsule (20 mg total) by mouth daily.   GOODSENSE ARTHRITIS PAIN 1 % GEL SMARTSIG:2-4 Gram(s) Topical 1-4 Times Daily PRN   levothyroxine (SYNTHROID) 112 MCG tablet TAKE 1 TABLET(112 MCG) BY MOUTH DAILY   losartan (COZAAR) 100 MG tablet Take 1 tablet (100 mg total) by mouth daily.   Melatonin 10 MG TABS Take 1 tablet by mouth daily.   No facility-administered encounter medications on file as of 10/06/2023.     Lab Results  Component Value Date   WBC 4.6 01/07/2023   HGB 12.1 01/07/2023   HCT 34.8 (L) 01/07/2023   PLT 292.0 01/07/2023   GLUCOSE 97 10/01/2023   CHOL 203 (H) 10/01/2023   TRIG 84.0 10/01/2023   HDL 52.50 10/01/2023   LDLCALC 134 (H) 10/01/2023   ALT 11 10/01/2023   AST 15 10/01/2023   NA 140 10/01/2023   K 3.8 10/01/2023   CL 103 10/01/2023   CREATININE 0.72 10/01/2023   BUN 13 10/01/2023   CO2 31 10/01/2023   TSH 1.03 05/11/2023   HGBA1C 5.6  12/04/2017    MM 3D DIAGNOSTIC MAMMOGRAM UNILATERAL LEFT BREAST  Result Date: 05/05/2023 CLINICAL DATA:  Recalled from screening for left breast mass. EXAM: DIGITAL DIAGNOSTIC UNILATERAL LEFT MAMMOGRAM WITH TOMOSYNTHESIS; ULTRASOUND LEFT BREAST LIMITED TECHNIQUE: Left digital diagnostic mammography and breast tomosynthesis was performed.; Targeted ultrasound examination of the left breast was performed. COMPARISON:  Previous exam(s). ACR Breast Density Category b: There are scattered areas of fibroglandular density. FINDINGS: Persistent low-density oval mass within the upper-outer left breast, further evaluated with spot compression views. Targeted ultrasound is performed, showing a 5 x 2 x 3 mm cyst left breast 2 o'clock position 4 cm from the nipple. IMPRESSION: Left breast cyst.  No mammographic evidence for malignancy. RECOMMENDATION: Screening mammogram in one year.(Code:SM-B-01Y) I have discussed the findings and recommendations with the patient. If applicable, a reminder letter will be sent to the patient regarding the next appointment. BI-RADS CATEGORY  2: Benign. Electronically Signed   By: Annia Belt M.D.   On: 05/05/2023 11:46   [place] LIMITED ULTRASOUND INCLUDING AXILLA LEFT BREAST   Result Date: 05/05/2023 CLINICAL DATA:  Recalled from screening for left breast mass. EXAM: DIGITAL DIAGNOSTIC UNILATERAL LEFT MAMMOGRAM WITH TOMOSYNTHESIS; ULTRASOUND LEFT BREAST LIMITED TECHNIQUE: Left digital diagnostic mammography and breast tomosynthesis was performed.; Targeted ultrasound examination of the left breast was performed. COMPARISON:  Previous exam(s). ACR Breast Density Category b: There are scattered areas of fibroglandular density. FINDINGS: Persistent low-density oval mass within the upper-outer left breast, further evaluated with spot compression views. Targeted ultrasound is performed, showing  a 5 x 2 x 3 mm cyst left breast 2 o'clock position 4 cm from the nipple. IMPRESSION: Left breast cyst.   No mammographic evidence for malignancy. RECOMMENDATION: Screening mammogram in one year.(Code:SM-B-01Y) I have discussed the findings and recommendations with the patient. If applicable, a reminder letter will be sent to the patient regarding the next appointment. BI-RADS CATEGORY  2: Benign. Electronically Signed   By: Annia Belt M.D.   On: 05/05/2023 11:46      Assessment & Plan:  Routine general medical examination at a health care facility  Healthcare maintenance Assessment & Plan: Physical today 10/06/23. PAP 05/14/22 - negative with negative HPV. Colonoscopy 2019.  Recommended f/u in 10 years. Mammogram 04/27/23 - Birads 0. Mammogram f/u left breast diagnostic mammogram (05/05/23) - Birads II.  Recommended continue annual screening.    Hypercholesterolemia without hypertriglyceridemia Assessment & Plan: The 10-year ASCVD risk score (Arnett DK, et al., 2019) is: 6.1%   Values used to calculate the score:     Age: 76 years     Sex: Female     Is Non-Hispanic African American: No     Diabetic: No     Tobacco smoker: No     Systolic Blood Pressure: 114 mmHg     Is BP treated: Yes     HDL Cholesterol: 52.5 mg/dL     Total Cholesterol: 203 mg/dL  Low cholesterol diet and exercise.  Follow lipid panel.   Orders: -     CBC with Differential/Platelet; Future -     Lipid panel; Future -     Hepatic function panel; Future -     Basic metabolic panel; Future  Essential hypertension Assessment & Plan: Continue losartan 100mg  q day .  Blood pressure as outlined. Continue to follow pressures.  Follow metabolic panel.    Gastroesophageal reflux disease with esophagitis without hemorrhage Assessment & Plan: On nexium.  No upper symptoms reported.  Follow.    Adult hypothyroidism Assessment & Plan: On synthroid.  Follow tsh.   Orders: -     TSH; Future     Dale Maunie, MD

## 2023-10-07 NOTE — Telephone Encounter (Signed)
Pt is aware.  

## 2023-10-19 ENCOUNTER — Other Ambulatory Visit: Payer: Self-pay

## 2023-10-19 MED ORDER — LEVOTHYROXINE SODIUM 112 MCG PO TABS: 112.0000 ug | ORAL_TABLET | Freq: Every day | ORAL | 3 refills | Status: DC

## 2023-10-19 MED ORDER — ESOMEPRAZOLE MAGNESIUM 20 MG PO CPDR: 20.0000 mg | DELAYED_RELEASE_CAPSULE | Freq: Every day | ORAL | 3 refills | Status: DC

## 2023-10-29 ENCOUNTER — Other Ambulatory Visit: Payer: Self-pay | Admitting: Internal Medicine

## 2023-12-21 ENCOUNTER — Other Ambulatory Visit: Payer: Self-pay | Admitting: Internal Medicine

## 2024-04-04 ENCOUNTER — Other Ambulatory Visit: Payer: BLUE CROSS/BLUE SHIELD

## 2024-04-08 ENCOUNTER — Ambulatory Visit: Payer: BLUE CROSS/BLUE SHIELD | Admitting: Internal Medicine

## 2024-04-11 ENCOUNTER — Other Ambulatory Visit (INDEPENDENT_AMBULATORY_CARE_PROVIDER_SITE_OTHER): Payer: BLUE CROSS/BLUE SHIELD

## 2024-04-11 DIAGNOSIS — E039 Hypothyroidism, unspecified: Secondary | ICD-10-CM | POA: Diagnosis not present

## 2024-04-11 DIAGNOSIS — E7800 Pure hypercholesterolemia, unspecified: Secondary | ICD-10-CM | POA: Diagnosis not present

## 2024-04-11 LAB — CBC WITH DIFFERENTIAL/PLATELET
Basophils Absolute: 0 10*3/uL (ref 0.0–0.1)
Basophils Relative: 0.7 % (ref 0.0–3.0)
Eosinophils Absolute: 0.2 10*3/uL (ref 0.0–0.7)
Eosinophils Relative: 3.5 % (ref 0.0–5.0)
HCT: 38.7 % (ref 36.0–46.0)
Hemoglobin: 13 g/dL (ref 12.0–15.0)
Lymphocytes Relative: 25.5 % (ref 12.0–46.0)
Lymphs Abs: 1.4 10*3/uL (ref 0.7–4.0)
MCHC: 33.5 g/dL (ref 30.0–36.0)
MCV: 84 fl (ref 78.0–100.0)
Monocytes Absolute: 0.4 10*3/uL (ref 0.1–1.0)
Monocytes Relative: 7.9 % (ref 3.0–12.0)
Neutro Abs: 3.4 10*3/uL (ref 1.4–7.7)
Neutrophils Relative %: 62.4 % (ref 43.0–77.0)
Platelets: 319 10*3/uL (ref 150.0–400.0)
RBC: 4.6 Mil/uL (ref 3.87–5.11)
RDW: 14.1 % (ref 11.5–15.5)
WBC: 5.5 10*3/uL (ref 4.0–10.5)

## 2024-04-11 LAB — BASIC METABOLIC PANEL WITH GFR
BUN: 12 mg/dL (ref 6–23)
CO2: 30 meq/L (ref 19–32)
Calcium: 9.7 mg/dL (ref 8.4–10.5)
Chloride: 100 meq/L (ref 96–112)
Creatinine, Ser: 0.73 mg/dL (ref 0.40–1.20)
GFR: 86.22 mL/min (ref >60.00)
Glucose, Bld: 103 mg/dL — ABNORMAL HIGH (ref 70–99)
Potassium: 4.2 meq/L (ref 3.5–5.1)
Sodium: 137 meq/L (ref 135–145)

## 2024-04-11 LAB — HEPATIC FUNCTION PANEL
ALT: 11 U/L (ref 0–35)
AST: 15 U/L (ref 0–37)
Albumin: 4.4 g/dL (ref 3.5–5.2)
Alkaline Phosphatase: 54 U/L (ref 39–117)
Bilirubin, Direct: 0.1 mg/dL (ref 0.0–0.3)
Total Bilirubin: 0.7 mg/dL (ref 0.2–1.2)
Total Protein: 7 g/dL (ref 6.0–8.3)

## 2024-04-11 LAB — LIPID PANEL
Cholesterol: 194 mg/dL (ref 0–200)
HDL: 49.1 mg/dL (ref >39.00)
LDL Cholesterol: 120 mg/dL — ABNORMAL HIGH (ref 0–99)
NonHDL: 145.17
Total CHOL/HDL Ratio: 4
Triglycerides: 127 mg/dL (ref 0.0–149.0)
VLDL: 25.4 mg/dL (ref 0.0–40.0)

## 2024-04-11 LAB — TSH: TSH: 0.36 u[IU]/mL (ref 0.35–5.50)

## 2024-04-14 ENCOUNTER — Encounter: Payer: Self-pay | Admitting: Internal Medicine

## 2024-04-14 ENCOUNTER — Ambulatory Visit (INDEPENDENT_AMBULATORY_CARE_PROVIDER_SITE_OTHER): Payer: BLUE CROSS/BLUE SHIELD | Admitting: Internal Medicine

## 2024-04-14 VITALS — BP 126/70 | HR 67 | Temp 98.0°F | Resp 16 | Ht 64.0 in | Wt 163.6 lb

## 2024-04-14 DIAGNOSIS — I10 Essential (primary) hypertension: Secondary | ICD-10-CM

## 2024-04-14 DIAGNOSIS — Z136 Encounter for screening for cardiovascular disorders: Secondary | ICD-10-CM | POA: Insufficient documentation

## 2024-04-14 DIAGNOSIS — E7800 Pure hypercholesterolemia, unspecified: Secondary | ICD-10-CM

## 2024-04-14 DIAGNOSIS — E039 Hypothyroidism, unspecified: Secondary | ICD-10-CM

## 2024-04-14 DIAGNOSIS — Z1231 Encounter for screening mammogram for malignant neoplasm of breast: Secondary | ICD-10-CM | POA: Diagnosis not present

## 2024-04-14 DIAGNOSIS — Z1211 Encounter for screening for malignant neoplasm of colon: Secondary | ICD-10-CM

## 2024-04-14 DIAGNOSIS — Z0000 Encounter for general adult medical examination without abnormal findings: Secondary | ICD-10-CM | POA: Diagnosis not present

## 2024-04-14 MED ORDER — LOSARTAN POTASSIUM 100 MG PO TABS: 100.0000 mg | ORAL_TABLET | Freq: Every day | ORAL | 1 refills | Status: DC

## 2024-04-14 NOTE — Assessment & Plan Note (Signed)
On synthroid.  Follow tsh.   

## 2024-04-14 NOTE — Progress Notes (Addendum)
 Subjective:    Patient ID: Kristine Strickland, female    DOB: [birth year], 68 y.o.   MRN: [id number]  Patient here for  Chief Complaint  Patient presents with   Medical Management of Chronic Issues    HPI Here for a scheduled follow up - follow up regarding hypercholesterolemia, hypertension, hypothyroidism and GERD. She reports she is doing well. Feels good. Has adjusted her diet. Lost weight. No chest pain or sob reported. No abdominal pain or bowel change reported. Not requiring stool softener now. Bowels regular. She is participating in a wellness plan and is doing an "Eat right now program". Has done well with this.  Discussed labs. Discussed calculated cholesterol risk and discussed CT calcium score.    Past Medical History:  Diagnosis Date   Arthritis    right hip   Family history of adverse reaction to anesthesia    Mother - hard to wake   GERD (gastroesophageal reflux disease)    Hypertension    Hypothyroidism    Thyroid  disease    hypothyroidism   Past Surgical History:  Procedure Laterality Date   BLADDER STEM     CHOLECYSTECTOMY     COLONOSCOPY     COLONOSCOPY WITH PROPOFOL  N/A 07/16/2018   Procedure: COLONOSCOPY WITH PROPOFOL ;  Surgeon: Marnee Sink, MD;  Location: [facility];  Service: Endoscopy;  Laterality: N/A;   DILATION AND CURETTAGE OF UTERUS     Family History  Problem Relation Age of Onset   Miscarriages / Stillbirths Mother    Kidney disease Mother    COPD Mother    Asthma Mother    Arthritis Mother    Hypertension Mother    Hyperlipidemia Mother    Epilepsy Mother    Heart attack Father    Hearing loss Father    COPD Father    Arthritis Father    Hearing loss Sister    Hypothyroidism Sister    Diverticulitis Sister    Hyperlipidemia Brother    Hearing loss Brother    Alcohol abuse Brother    Arthritis Brother    Hypertension Brother    Alcohol abuse Brother    Hyperlipidemia Brother    Hypertension Brother    Arthritis  Brother    Breast cancer Maternal Aunt 74   Cancer Maternal Grandmother    Arthritis Maternal Grandmother    Pancreatic cancer Maternal Grandmother    Hypertension Maternal Grandmother    Heart failure Maternal Grandfather    Heart attack Paternal Grandfather    Hypertension Paternal Grandfather    Social History   Socioeconomic History   Marital status: Married    Spouse name: Not on file   Number of children: Not on file   Years of education: Not on file   Highest education level: Associate degree: occupational, Scientist, product/process development, or vocational program  Occupational History   Not on file  Tobacco Use   Smoking status: Never   Smokeless tobacco: Never  Vaping Use   Vaping status: Never Used  Substance and Sexual Activity   Alcohol use: No    Alcohol/week: 0.0 standard drinks of alcohol   Drug use: No   Sexual activity: Not on file  Other Topics Concern   Not on file  Social History Narrative   Not on file   Social Drivers of Health   Financial Resource Strain: Low Risk  (04/12/2024)   Overall Financial Resource Strain (CARDIA)    Difficulty of Paying Living Expenses: Not hard at all  Food Insecurity: No Food Insecurity (04/12/2024)   Hunger Vital Sign    Worried About Running Out of Food in the Last Year: Never true    Ran Out of Food in the Last Year: Never true  Transportation Needs: No Transportation Needs (04/12/2024)   PRAPARE - Administrator, Civil Service (Medical): No    Lack of Transportation (Non-Medical): No  Physical Activity: Sufficiently Active (04/12/2024)   Exercise Vital Sign    Days of Exercise per Week: 6 days    Minutes of Exercise per Session: 30 min  Stress: No Stress Concern Present (04/12/2024)   [employer] of Occupational Health - Occupational Stress Questionnaire    Feeling of Stress : Not at all  Social Connections: Socially Integrated (04/12/2024)   Social Connection and Isolation Panel [NHANES]    Frequency of Communication  with Friends and Family: More than three times a week    Frequency of Social Gatherings with Friends and Family: More than three times a week    Attends Religious Services: More than 4 times per year    Active Member of [employer] or Organizations: Yes    Attends Engineer, structural: More than 4 times per year    Marital Status: Married     Review of Systems  Constitutional:  Negative for appetite change and unexpected weight change.  HENT:  Negative for congestion and sinus pressure.   Respiratory:  Negative for cough, chest tightness and shortness of breath.   Cardiovascular:  Negative for chest pain, palpitations and leg swelling.  Gastrointestinal:  Negative for abdominal pain, diarrhea, nausea and vomiting.  Genitourinary:  Negative for difficulty urinating and dysuria.  Musculoskeletal:  Negative for joint swelling and myalgias.  Skin:  Negative for color change and rash.  Neurological:  Negative for dizziness and headaches.  Psychiatric/Behavioral:  Negative for agitation and dysphoric mood.        Objective:     BP 126/70   Pulse 67   Temp 98 F (36.7 C)   Resp 16   Ht 5\' 4"  (1.626 m)   Wt 163 lb 9.6 oz (74.2 kg)   SpO2 98%   BMI 28.08 kg/m  Wt Readings from Last 3 Encounters:  04/14/24 163 lb 9.6 oz (74.2 kg)  10/06/23 191 lb (86.6 kg)  05/13/23 200 lb 9.6 oz (91 kg)    Physical Exam Vitals reviewed.  Constitutional:      General: She is not in acute distress.    Appearance: Normal appearance.  HENT:     Head: Normocephalic and atraumatic.     Right Ear: External ear normal.     Left Ear: External ear normal.     Mouth/Throat:     Pharynx: No oropharyngeal exudate or posterior oropharyngeal erythema.  Eyes:     General: No scleral icterus.       Right eye: No discharge.        Left eye: No discharge.     Conjunctiva/sclera: Conjunctivae normal.  Neck:     Thyroid : No thyromegaly.  Cardiovascular:     Rate and Rhythm: Normal rate and regular  rhythm.  Pulmonary:     Effort: No respiratory distress.     Breath sounds: Normal breath sounds. No wheezing.  Abdominal:     General: Bowel sounds are normal.     Palpations: Abdomen is soft.     Tenderness: There is no abdominal tenderness.  Musculoskeletal:        General:  No swelling or tenderness.     Cervical back: Neck supple. No tenderness.  Lymphadenopathy:     Cervical: No cervical adenopathy.  Skin:    Findings: No erythema or rash.  Neurological:     Mental Status: She is alert.  Psychiatric:        Mood and Affect: Mood normal.        Behavior: Behavior normal.         Outpatient Encounter Medications as of 04/14/2024  Medication Sig   aspirin  81 MG EC tablet Take 1 tablet by mouth daily.   cetirizine (ZYRTEC) 10 MG tablet Take 1 tablet by mouth daily.   Cholecalciferol 50 MCG (2000 UT) CAPS Take 1 capsule by mouth daily.   esomeprazole  (NEXIUM ) 20 MG capsule Take 1 capsule (20 mg total) by mouth daily.   GOODSENSE ARTHRITIS PAIN 1 % GEL SMARTSIG:2-4 Gram(s) Topical 1-4 Times Daily PRN   levothyroxine  (SYNTHROID ) 112 MCG tablet TAKE 1 TABLET(112 MCG) BY MOUTH DAILY   losartan  (COZAAR ) 100 MG tablet Take 1 tablet (100 mg total) by mouth daily.   Melatonin 10 MG TABS Take 1 tablet by mouth daily.   [DISCONTINUED] docusate sodium (COLACE) 100 MG capsule Take 2 capsules by mouth daily.   [DISCONTINUED] losartan  (COZAAR ) 100 MG tablet TAKE 1 TABLET BY MOUTH EVERY DAY   No facility-administered encounter medications on file as of 04/14/2024.     Lab Results  Component Value Date   WBC 5.5 04/11/2024   HGB 13.0 04/11/2024   HCT 38.7 04/11/2024   PLT 319.0 04/11/2024   GLUCOSE 103 (H) 04/11/2024   CHOL 194 04/11/2024   TRIG 127.0 04/11/2024   HDL 49.10 04/11/2024   LDLCALC 120 (H) 04/11/2024   ALT 11 04/11/2024   AST 15 04/11/2024   NA 137 04/11/2024   K 4.2 04/11/2024   CL 100 04/11/2024   CREATININE 0.73 04/11/2024   BUN 12 04/11/2024   CO2 30  04/11/2024   TSH 0.36 04/11/2024   HGBA1C 5.6 12/04/2017    MM 3D DIAGNOSTIC MAMMOGRAM UNILATERAL LEFT BREAST Result Date: 05/05/2023 CLINICAL DATA:  Recalled from screening for left breast mass. EXAM: DIGITAL DIAGNOSTIC UNILATERAL LEFT MAMMOGRAM WITH TOMOSYNTHESIS; ULTRASOUND LEFT BREAST LIMITED TECHNIQUE: Left digital diagnostic mammography and breast tomosynthesis was performed.; Targeted ultrasound examination of the left breast was performed. COMPARISON:  Previous exam(s). ACR Breast Density Category b: There are scattered areas of fibroglandular density. FINDINGS: Persistent low-density oval mass within the upper-outer left breast, further evaluated with spot compression views. Targeted ultrasound is performed, showing a 5 x 2 x 3 mm cyst left breast 2 o'clock position 4 cm from the nipple. IMPRESSION: Left breast cyst.  No mammographic evidence for malignancy. RECOMMENDATION: Screening mammogram in one year.(Code:SM-B-01Y) I have discussed the findings and recommendations with the patient. If applicable, a reminder letter will be sent to the patient regarding the next appointment. BI-RADS CATEGORY  2: Benign. Electronically Signed   By: Jone Neither M.D.   On: 05/05/2023 11:46   [place]  LIMITED ULTRASOUND INCLUDING AXILLA LEFT BREAST  Result Date: 05/05/2023 CLINICAL DATA:  Recalled from screening for left breast mass. EXAM: DIGITAL DIAGNOSTIC UNILATERAL LEFT MAMMOGRAM WITH TOMOSYNTHESIS; ULTRASOUND LEFT BREAST LIMITED TECHNIQUE: Left digital diagnostic mammography and breast tomosynthesis was performed.; Targeted ultrasound examination of the left breast was performed. COMPARISON:  Previous exam(s). ACR Breast Density Category b: There are scattered areas of fibroglandular density. FINDINGS: Persistent low-density oval mass within the upper-outer left breast, further evaluated with  spot compression views. Targeted ultrasound is performed, showing a 5 x 2 x 3 mm cyst left breast 2 o'clock position 4 cm  from the nipple. IMPRESSION: Left breast cyst.  No mammographic evidence for malignancy. RECOMMENDATION: Screening mammogram in one year.(Code:SM-B-01Y) I have discussed the findings and recommendations with the patient. If applicable, a reminder letter will be sent to the patient regarding the next appointment. BI-RADS CATEGORY  2: Benign. Electronically Signed   By: Jone Neither M.D.   On: 05/05/2023 11:46      Assessment & Plan:  Routine general medical examination at a health care facility  Hypercholesterolemia without hypertriglyceridemia Assessment & Plan: The 10-year ASCVD risk score (Arnett DK, et al., 2019) is: 7.5%   Values used to calculate the score:     Age: 52 years     Sex: Female     Is Non-Hispanic African American: No     Diabetic: No     Tobacco smoker: No     Systolic Blood Pressure: 126 mmHg     Is BP treated: Yes     HDL Cholesterol: 49.1 mg/dL     Total Cholesterol: 194 mg/dL  Discussed labs. Discussed calculated cholesterol risk. She wants to hold on starting cholesterol medication. Cholesterol improving. Continue diet and exercise.   Orders: -     Lipid panel; Future -     Hepatic function panel; Future -     Basic metabolic panel with GFR; Future  Adult hypothyroidism Assessment & Plan: On synthroid . Follow tsh.   Orders: -     TSH; Future  Visit for screening mammogram -     3D Screening Mammogram, Left and Right; Future  Colon cancer screening Assessment & Plan: Colonoscopy 2019.  Recommended f/u in 10 years.    Essential hypertension Assessment & Plan: Continue losartan  100mg  q day .  Blood pressure as outlined. Continue to follow pressures.  No changes in medication today. Follow metabolic panel.    Encounter for screening for coronary artery disease Assessment & Plan: Discussed CT calcium score. Will notify me if agreeable. Discussed family history.    Other orders -     Losartan  Potassium; Take 1 tablet (100 mg total) by mouth daily.   Dispense: 90 tablet; Refill: 1     Dellar Fenton, MD

## 2024-04-14 NOTE — Assessment & Plan Note (Signed)
 Continue losartan  100mg  q day .  Blood pressure as outlined. Continue to follow pressures.  No changes in medication today. Follow metabolic panel.

## 2024-04-14 NOTE — Addendum Note (Signed)
 Addended by: Raejean Bullock on: 04/14/2024 09:43 AM   Modules accepted: Level of Service

## 2024-04-14 NOTE — Assessment & Plan Note (Signed)
 Discussed CT calcium score. Will notify me if agreeable. Discussed family history.

## 2024-04-14 NOTE — Assessment & Plan Note (Signed)
Colonoscopy 2019.  Recommended f/u in 10 years.  

## 2024-04-14 NOTE — Assessment & Plan Note (Addendum)
 The 10-year ASCVD risk score (Arnett DK, et al., 2019) is: 7.5%   Values used to calculate the score:     Age: 68 years     Sex: Female     Is Non-Hispanic African American: No     Diabetic: No     Tobacco smoker: No     Systolic Blood Pressure: 126 mmHg     Is BP treated: Yes     HDL Cholesterol: 49.1 mg/dL     Total Cholesterol: 194 mg/dL  Discussed labs. Discussed calculated cholesterol risk. She wants to hold on starting cholesterol medication. Cholesterol improving. Continue diet and exercise.

## 2024-06-10 ENCOUNTER — Ambulatory Visit
Admission: RE | Admit: 2024-06-10 | Discharge: 2024-06-10 | Disposition: A | Discharge status: Home / Self Care | Source: Ambulatory Visit | Attending: Internal Medicine | Admitting: Internal Medicine

## 2024-06-10 DIAGNOSIS — Z1231 Encounter for screening mammogram for malignant neoplasm of breast: Secondary | ICD-10-CM | POA: Insufficient documentation

## 2024-10-05 ENCOUNTER — Other Ambulatory Visit: Payer: Self-pay | Admitting: Internal Medicine

## 2024-10-13 ENCOUNTER — Other Ambulatory Visit (INDEPENDENT_AMBULATORY_CARE_PROVIDER_SITE_OTHER)

## 2024-10-13 ENCOUNTER — Ambulatory Visit: Payer: Self-pay | Admitting: Internal Medicine

## 2024-10-13 DIAGNOSIS — E039 Hypothyroidism, unspecified: Secondary | ICD-10-CM | POA: Diagnosis not present

## 2024-10-13 DIAGNOSIS — E7800 Pure hypercholesterolemia, unspecified: Secondary | ICD-10-CM | POA: Diagnosis not present

## 2024-10-13 LAB — LIPID PANEL
Cholesterol: 219 mg/dL — ABNORMAL HIGH (ref 0–200)
HDL: 58.4 mg/dL (ref >39.00)
LDL Cholesterol: 145 mg/dL — ABNORMAL HIGH (ref 0–99)
NonHDL: 160.21
Total CHOL/HDL Ratio: 4
Triglycerides: 76 mg/dL (ref 0.0–149.0)
VLDL: 15.2 mg/dL (ref 0.0–40.0)

## 2024-10-13 LAB — BASIC METABOLIC PANEL WITH GFR
BUN: 14 mg/dL (ref 6–23)
CO2: 30 meq/L (ref 19–32)
Calcium: 9.4 mg/dL (ref 8.4–10.5)
Chloride: 101 meq/L (ref 96–112)
Creatinine, Ser: 0.75 mg/dL (ref 0.40–1.20)
GFR: 83.17 mL/min (ref >60.00)
Glucose, Bld: 91 mg/dL (ref 70–99)
Potassium: 3.6 meq/L (ref 3.5–5.1)
Sodium: 141 meq/L (ref 135–145)

## 2024-10-13 LAB — HEPATIC FUNCTION PANEL
ALT: 9 U/L (ref 0–35)
AST: 15 U/L (ref 0–37)
Albumin: 4.4 g/dL (ref 3.5–5.2)
Alkaline Phosphatase: 48 U/L (ref 39–117)
Bilirubin, Direct: 0.2 mg/dL (ref 0.0–0.3)
Total Bilirubin: 0.8 mg/dL (ref 0.2–1.2)
Total Protein: 6.8 g/dL (ref 6.0–8.3)

## 2024-10-13 LAB — TSH: TSH: 1.17 u[IU]/mL (ref 0.35–5.50)

## 2024-10-18 ENCOUNTER — Encounter: Payer: Self-pay | Admitting: Internal Medicine

## 2024-10-18 ENCOUNTER — Ambulatory Visit: Admitting: Internal Medicine

## 2024-10-18 VITALS — BP 120/70 | HR 65 | Temp 97.7°F | Resp 17 | Ht 64.0 in | Wt 149.5 lb

## 2024-10-18 DIAGNOSIS — F439 Reaction to severe stress, unspecified: Secondary | ICD-10-CM | POA: Insufficient documentation

## 2024-10-18 DIAGNOSIS — Z136 Encounter for screening for cardiovascular disorders: Secondary | ICD-10-CM

## 2024-10-18 DIAGNOSIS — E039 Hypothyroidism, unspecified: Secondary | ICD-10-CM

## 2024-10-18 DIAGNOSIS — E7800 Pure hypercholesterolemia, unspecified: Secondary | ICD-10-CM | POA: Diagnosis not present

## 2024-10-18 DIAGNOSIS — I10 Essential (primary) hypertension: Secondary | ICD-10-CM

## 2024-10-18 DIAGNOSIS — Z0000 Encounter for general adult medical examination without abnormal findings: Secondary | ICD-10-CM

## 2024-10-18 MED ORDER — LOSARTAN POTASSIUM 100 MG PO TABS: 100.0000 mg | ORAL_TABLET | Freq: Every day | ORAL | 1 refills | Status: AC

## 2024-10-18 NOTE — Assessment & Plan Note (Addendum)
 The 10-year ASCVD risk score (Arnett DK, et al., 2019) is: 7.5%   Values used to calculate the score:     Age: 68 years     Clincally relevant sex: Female     Is Non-Hispanic African American: No     Diabetic: No     Tobacco smoker: No     Systolic Blood Pressure: 120 mmHg     Is BP treated: Yes     HDL Cholesterol: 58.4 mg/dL     Total Cholesterol: 219 mg/dL  Discussed labs. Discussed calculated cholesterol risk. She wants to hold on starting cholesterol medication. Cholesterol improving. Continue diet and exercise. Discussed calcium score. Agreeable.

## 2024-10-18 NOTE — Assessment & Plan Note (Signed)
 Physical today 10/18/24. PAP 05/14/22 - negative with negative HPV. Colonoscopy 2019.  Recommended f/u in 10 years. Mammogram 04/27/23 - Birads 0. Mammogram f/u left breast diagnostic mammogram (05/05/23) - Birads II.  Recommended continue annual screening. Mammogram 06/10/24 - birads I.

## 2024-10-18 NOTE — Progress Notes (Signed)
 Subjective:    Patient ID: Kristine Strickland, female    DOB: 1956/03/20, 68 y.o.   MRN: [id number]  Patient here for  Chief Complaint  Patient presents with   Medical Management of Chronic Issues    Yearly follow-up & review labs    HPI Here for a physical exam. She is doing well. Stays active. No chest pain or sob reported. No abdominal pain or bowel change reported. Increased stress. Multiple deaths in the family. She has good support. Does not feel needs any further intervention. Discussed calcium score. Agreeable. Discussed bone density. Will notify me when decides to schedule.    Past Medical History:  Diagnosis Date   Arthritis    right hip   Family history of adverse reaction to anesthesia    Mother - hard to wake   GERD (gastroesophageal reflux disease)    Hypertension    Hypothyroidism    Thyroid  disease    hypothyroidism   Past Surgical History:  Procedure Laterality Date   BLADDER STEM     CHOLECYSTECTOMY     COLONOSCOPY     COLONOSCOPY WITH PROPOFOL  N/A 07/16/2018   Procedure: COLONOSCOPY WITH PROPOFOL ;  Surgeon: Jinny Carmine, MD;  Location: [facility];  Service: Endoscopy;  Laterality: N/A;   DILATION AND CURETTAGE OF UTERUS     Family History  Problem Relation Age of Onset   Miscarriages / Stillbirths Mother    Kidney disease Mother    COPD Mother    Asthma Mother    Arthritis Mother    Hypertension Mother    Hyperlipidemia Mother    Epilepsy Mother    Heart attack Father    Hearing loss Father    COPD Father    Arthritis Father    Hearing loss Sister    Hypothyroidism Sister    Diverticulitis Sister    Hyperlipidemia Brother    Hearing loss Brother    Alcohol abuse Brother    Arthritis Brother    Hypertension Brother    Alcohol abuse Brother    Hyperlipidemia Brother    Hypertension Brother    Arthritis Brother    Breast cancer Maternal Aunt 74   Cancer Maternal Grandmother    Arthritis Maternal Grandmother    Pancreatic cancer  Maternal Grandmother    Hypertension Maternal Grandmother    Heart failure Maternal Grandfather    Heart attack Paternal Grandfather    Hypertension Paternal Grandfather    Social History   Socioeconomic History   Marital status: Married    Spouse name: Not on file   Number of children: Not on file   Years of education: Not on file   Highest education level: Associate degree: occupational, scientist, product/process development, or vocational program  Occupational History   Not on file  Tobacco Use   Smoking status: Never   Smokeless tobacco: Never  Vaping Use   Vaping status: Never Used  Substance and Sexual Activity   Alcohol use: No    Alcohol/week: 0.0 standard drinks of alcohol   Drug use: No   Sexual activity: Not on file  Other Topics Concern   Not on file  Social History Narrative   Not on file   Social Drivers of Health   Financial Resource Strain: Low Risk  (10/17/2024)   Overall Financial Resource Strain (CARDIA)    Difficulty of Paying Living Expenses: Not hard at all  Food Insecurity: No Food Insecurity (10/17/2024)   Hunger Vital Sign    Worried About Running  Out of Food in the Last Year: Never true    Ran Out of Food in the Last Year: Never true  Transportation Needs: No Transportation Needs (10/17/2024)   PRAPARE - Administrator, Civil Service (Medical): No    Lack of Transportation (Non-Medical): No  Physical Activity: Sufficiently Active (10/17/2024)   Exercise Vital Sign    Days of Exercise per Week: 5 days    Minutes of Exercise per Session: 30 min  Stress: No Stress Concern Present (10/17/2024)   [employer] of Occupational Health - Occupational Stress Questionnaire    Feeling of Stress: Not at all  Social Connections: Socially Integrated (10/17/2024)   Social Connection and Isolation Panel    Frequency of Communication with Friends and Family: More than three times a week    Frequency of Social Gatherings with Friends and Family: More than three times  a week    Attends Religious Services: More than 4 times per year    Active Member of [employer] or Organizations: Yes    Attends Engineer, Structural: More than 4 times per year    Marital Status: Married     Review of Systems  Constitutional:  Negative for appetite change and unexpected weight change.  HENT:  Negative for congestion, sinus pressure and sore throat.   Eyes:  Negative for pain and visual disturbance.  Respiratory:  Negative for cough, chest tightness and shortness of breath.   Cardiovascular:  Negative for chest pain, palpitations and leg swelling.  Gastrointestinal:  Negative for abdominal pain, diarrhea, nausea and vomiting.  Genitourinary:  Negative for difficulty urinating and dysuria.  Musculoskeletal:  Negative for joint swelling and myalgias.  Skin:  Negative for color change and rash.  Neurological:  Negative for dizziness and headaches.  Hematological:  Negative for adenopathy. Does not bruise/bleed easily.  Psychiatric/Behavioral:  Negative for agitation and dysphoric mood.        Objective:     BP 120/70 (Cuff Size: Normal)   Pulse 65   Temp 97.7 F (36.5 C) (Oral)   Resp 17   Ht 5' 4 (1.626 m)   Wt 149 lb 8 oz (67.8 kg)   SpO2 98%   BMI 25.66 kg/m  Wt Readings from Last 3 Encounters:  10/18/24 149 lb 8 oz (67.8 kg)  04/14/24 163 lb 9.6 oz (74.2 kg)  10/06/23 191 lb (86.6 kg)    Physical Exam Vitals reviewed.  Constitutional:      General: She is not in acute distress.    Appearance: Normal appearance.  HENT:     Head: Normocephalic and atraumatic.     Right Ear: External ear normal.     Left Ear: External ear normal.     Mouth/Throat:     Pharynx: No oropharyngeal exudate or posterior oropharyngeal erythema.  Eyes:     General: No scleral icterus.       Right eye: No discharge.        Left eye: No discharge.     Conjunctiva/sclera: Conjunctivae normal.  Neck:     Thyroid : No thyromegaly.  Cardiovascular:     Rate and  Rhythm: Normal rate and regular rhythm.  Pulmonary:     Effort: No respiratory distress.     Breath sounds: Normal breath sounds. No wheezing.  Abdominal:     General: Bowel sounds are normal.     Palpations: Abdomen is soft.     Tenderness: There is no abdominal tenderness.  Musculoskeletal:  General: No swelling or tenderness.     Cervical back: Neck supple. No tenderness.  Lymphadenopathy:     Cervical: No cervical adenopathy.  Skin:    Findings: No erythema or rash.  Neurological:     Mental Status: She is alert.  Psychiatric:        Mood and Affect: Mood normal.        Behavior: Behavior normal.         Outpatient Encounter Medications as of 10/18/2024  Medication Sig   aspirin  81 MG EC tablet Take 1 tablet by mouth daily.   cetirizine (ZYRTEC) 10 MG tablet Take 1 tablet by mouth daily.   Cholecalciferol 50 MCG (2000 UT) CAPS Take 1 capsule by mouth daily.   esomeprazole  (NEXIUM ) 20 MG capsule TAKE 1 CAPSULE BY MOUTH EVERY DAY   GOODSENSE ARTHRITIS PAIN 1 % GEL SMARTSIG:2-4 Gram(s) Topical 1-4 Times Daily PRN   levothyroxine  (SYNTHROID ) 112 MCG tablet TAKE 1 TABLET BY MOUTH DAILY BEFORE BREAKFAST.   Melatonin 10 MG TABS Take 1 tablet by mouth daily.   losartan  (COZAAR ) 100 MG tablet Take 1 tablet (100 mg total) by mouth daily.   [DISCONTINUED] losartan  (COZAAR ) 100 MG tablet Take 1 tablet (100 mg total) by mouth daily.   No facility-administered encounter medications on file as of 10/18/2024.     Lab Results  Component Value Date   WBC 5.5 04/11/2024   HGB 13.0 04/11/2024   HCT 38.7 04/11/2024   PLT 319.0 04/11/2024   GLUCOSE 91 10/13/2024   CHOL 219 (H) 10/13/2024   TRIG 76.0 10/13/2024   HDL 58.40 10/13/2024   LDLCALC 145 (H) 10/13/2024   ALT 9 10/13/2024   AST 15 10/13/2024   NA 141 10/13/2024   K 3.6 10/13/2024   CL 101 10/13/2024   CREATININE 0.75 10/13/2024   BUN 14 10/13/2024   CO2 30 10/13/2024   TSH 1.17 10/13/2024   HGBA1C 5.6  12/04/2017    MM 3D SCREENING MAMMOGRAM BILATERAL BREAST Result Date: 06/14/2024 CLINICAL DATA:  Screening. EXAM: DIGITAL SCREENING BILATERAL MAMMOGRAM WITH TOMOSYNTHESIS AND CAD TECHNIQUE: Bilateral screening digital craniocaudal and mediolateral oblique mammograms were obtained. Bilateral screening digital breast tomosynthesis was performed. The images were evaluated with computer-aided detection. COMPARISON:  Previous exam(s). ACR Breast Density Category b: There are scattered areas of fibroglandular density. FINDINGS: There are no findings suspicious for malignancy. IMPRESSION: No mammographic evidence of malignancy. A result letter of this screening mammogram will be mailed directly to the patient. RECOMMENDATION: Screening mammogram in one year. (Code:SM-B-01Y) BI-RADS CATEGORY  1: Negative. Electronically Signed   By: Inocente Ast M.D.   On: 06/14/2024 13:51       Assessment & Plan:  Essential hypertension Assessment & Plan: Continue losartan  100mg  q day .  Blood pressure as outlined. Continue to follow pressures.  No changes in medication today. Follow metabolic panel.    Hypercholesterolemia without hypertriglyceridemia Assessment & Plan: The 10-year ASCVD risk score (Arnett DK, et al., 2019) is: 7.5%   Values used to calculate the score:     Age: 21 years     Clincally relevant sex: Female     Is Non-Hispanic African American: No     Diabetic: No     Tobacco smoker: No     Systolic Blood Pressure: 120 mmHg     Is BP treated: Yes     HDL Cholesterol: 58.4 mg/dL     Total Cholesterol: 219 mg/dL  Discussed labs. Discussed calculated cholesterol risk.  She wants to hold on starting cholesterol medication. Cholesterol improving. Continue diet and exercise. Discussed calcium score. Agreeable.   Orders: -     Lipid panel; Future -     Hepatic function panel; Future -     Basic metabolic panel with GFR; Future  Adult hypothyroidism Assessment & Plan: On synthroid . Follow  tsh.   Orders: -     TSH; Future  Healthcare maintenance Assessment & Plan: Physical today 10/18/24. PAP 05/14/22 - negative with negative HPV. Colonoscopy 2019.  Recommended f/u in 10 years. Mammogram 04/27/23 - Birads 0. Mammogram f/u left breast diagnostic mammogram (05/05/23) - Birads II.  Recommended continue annual screening. Mammogram 06/10/24 - birads I.    Encounter for screening for coronary artery disease -     CT CARDIAC SCORING (SELF PAY ONLY); Future  Stress Assessment & Plan: Increased stress. Discussed. Has good support. Will notify if feels needs further intervention. Follow.    Other orders -     Losartan  Potassium; Take 1 tablet (100 mg total) by mouth daily.  Dispense: 90 tablet; Refill: 1     Allena Hamilton, MD

## 2024-10-18 NOTE — Assessment & Plan Note (Signed)
 Increased stress. Discussed. Has good support. Will notify if feels needs further intervention. Follow.

## 2024-10-18 NOTE — Assessment & Plan Note (Signed)
On synthroid.  Follow tsh.   

## 2024-10-18 NOTE — Assessment & Plan Note (Signed)
 Continue losartan  100mg  q day .  Blood pressure as outlined. Continue to follow pressures.  No changes in medication today. Follow metabolic panel.

## 2024-10-28 ENCOUNTER — Ambulatory Visit
Admission: RE | Admit: 2024-10-28 | Discharge: 2024-10-28 | Disposition: A | Discharge status: Home / Self Care | Payer: Self-pay | Source: Ambulatory Visit | Attending: Internal Medicine | Admitting: Internal Medicine

## 2024-10-28 DIAGNOSIS — Z136 Encounter for screening for cardiovascular disorders: Secondary | ICD-10-CM

## 2024-10-30 ENCOUNTER — Ambulatory Visit: Payer: Self-pay | Admitting: Internal Medicine

## 2024-11-01 ENCOUNTER — Other Ambulatory Visit: Payer: Self-pay | Admitting: Internal Medicine

## 2024-11-03 ENCOUNTER — Encounter: Payer: Self-pay | Admitting: Internal Medicine

## 2024-11-03 NOTE — Telephone Encounter (Signed)
 See result note.

## 2024-11-04 MED ORDER — ROSUVASTATIN CALCIUM 10 MG PO TABS: 10.0000 mg | ORAL_TABLET | Freq: Every day | ORAL | 2 refills | Status: DC

## 2024-11-04 NOTE — Telephone Encounter (Signed)
 Rx sent in for crestor 10mg  q day. Needs liver panel checked 6 weeks after starting medication.

## 2024-12-19 ENCOUNTER — Other Ambulatory Visit (INDEPENDENT_AMBULATORY_CARE_PROVIDER_SITE_OTHER)

## 2024-12-19 DIAGNOSIS — E7800 Pure hypercholesterolemia, unspecified: Secondary | ICD-10-CM | POA: Diagnosis not present

## 2024-12-19 DIAGNOSIS — E039 Hypothyroidism, unspecified: Secondary | ICD-10-CM

## 2024-12-19 LAB — HEPATIC FUNCTION PANEL
ALT: 13 U/L (ref 3–35)
AST: 19 U/L (ref 5–37)
Albumin: 4.5 g/dL (ref 3.5–5.2)
Alkaline Phosphatase: 46 U/L (ref 39–117)
Bilirubin, Direct: 0.1 mg/dL (ref 0.1–0.3)
Total Bilirubin: 0.6 mg/dL (ref 0.2–1.2)
Total Protein: 6.7 g/dL (ref 6.0–8.3)

## 2024-12-19 LAB — BASIC METABOLIC PANEL WITH GFR
BUN: 11 mg/dL (ref 6–23)
CO2: 31 meq/L (ref 19–32)
Calcium: 10.1 mg/dL (ref 8.4–10.5)
Chloride: 100 meq/L (ref 96–112)
Creatinine, Ser: 0.71 mg/dL (ref 0.40–1.20)
GFR: 88.71 mL/min
Glucose, Bld: 88 mg/dL (ref 70–99)
Potassium: 4.6 meq/L (ref 3.5–5.1)
Sodium: 139 meq/L (ref 135–145)

## 2024-12-19 LAB — LIPID PANEL
Cholesterol: 137 mg/dL (ref 28–200)
HDL: 63.6 mg/dL
LDL Cholesterol: 63 mg/dL (ref 10–99)
NonHDL: 73.82
Total CHOL/HDL Ratio: 2
Triglycerides: 55 mg/dL (ref 10.0–149.0)
VLDL: 11 mg/dL (ref 0.0–40.0)

## 2024-12-19 LAB — TSH: TSH: 1.87 u[IU]/mL (ref 0.35–5.50)

## 2025-01-10 NOTE — Progress Notes (Signed)
 Sent to [facility] had to wait for EOB.

## 2025-01-27 ENCOUNTER — Other Ambulatory Visit: Payer: Self-pay | Admitting: Internal Medicine

## 2025-02-21 ENCOUNTER — Other Ambulatory Visit

## 2025-02-23 ENCOUNTER — Ambulatory Visit: Admitting: Internal Medicine
